# Patient Record
Sex: Female | Born: 1950 | Race: White | Hispanic: No | Marital: Married | Smoking: Never smoker
Health system: Southern US, Community
[De-identification: ages and names within clinical notes are randomized; demographics above are authoritative.]

## PROBLEM LIST (undated history)

## (undated) DIAGNOSIS — K219 Gastro-esophageal reflux disease without esophagitis: Secondary | ICD-10-CM

## (undated) DIAGNOSIS — M1990 Unspecified osteoarthritis, unspecified site: Secondary | ICD-10-CM

## (undated) DIAGNOSIS — I10 Essential (primary) hypertension: Secondary | ICD-10-CM

## (undated) DIAGNOSIS — E119 Type 2 diabetes mellitus without complications: Secondary | ICD-10-CM

## (undated) DIAGNOSIS — K760 Fatty (change of) liver, not elsewhere classified: Secondary | ICD-10-CM

## (undated) DIAGNOSIS — M51369 Other intervertebral disc degeneration, lumbar region without mention of lumbar back pain or lower extremity pain: Secondary | ICD-10-CM

## (undated) DIAGNOSIS — M5136 Other intervertebral disc degeneration, lumbar region: Secondary | ICD-10-CM

## (undated) HISTORY — PX: CHOLECYSTECTOMY: SHX55

## (undated) HISTORY — PX: ARM WOUND REPAIR / CLOSURE: SUR1141

## (undated) HISTORY — PX: DILATION AND CURETTAGE OF UTERUS: SHX78

---

## 1998-05-22 ENCOUNTER — Other Ambulatory Visit: Admission: RE | Admit: 1998-05-22 | Discharge: 1998-05-22 | Payer: Self-pay | Admitting: Obstetrics & Gynecology

## 1999-06-26 ENCOUNTER — Other Ambulatory Visit: Admission: RE | Admit: 1999-06-26 | Discharge: 1999-06-26 | Payer: Self-pay | Admitting: Obstetrics & Gynecology

## 2000-01-15 ENCOUNTER — Emergency Department (HOSPITAL_COMMUNITY): Admission: EM | Admit: 2000-01-15 | Discharge: 2000-01-16 | Payer: Self-pay | Admitting: Emergency Medicine

## 2000-08-20 ENCOUNTER — Other Ambulatory Visit: Admission: RE | Admit: 2000-08-20 | Discharge: 2000-08-20 | Payer: Self-pay | Admitting: Obstetrics & Gynecology

## 2002-01-19 ENCOUNTER — Other Ambulatory Visit: Admission: RE | Admit: 2002-01-19 | Discharge: 2002-01-19 | Payer: Self-pay | Admitting: Obstetrics & Gynecology

## 2009-02-27 ENCOUNTER — Encounter (INDEPENDENT_AMBULATORY_CARE_PROVIDER_SITE_OTHER): Payer: Self-pay | Admitting: *Deleted

## 2009-03-27 ENCOUNTER — Encounter (INDEPENDENT_AMBULATORY_CARE_PROVIDER_SITE_OTHER): Payer: Self-pay

## 2009-03-28 ENCOUNTER — Ambulatory Visit: Payer: Self-pay | Admitting: Gastroenterology

## 2009-04-14 ENCOUNTER — Ambulatory Visit: Payer: Self-pay | Admitting: Gastroenterology

## 2009-04-17 ENCOUNTER — Encounter: Payer: Self-pay | Admitting: Gastroenterology

## 2010-04-03 NOTE — Procedures (Signed)
Summary: Colonoscopy  Patient: [name] Note: All result statuses are Final unless otherwise noted.  Tests: (1) Colonoscopy (COL)   COL Colonoscopy           DONE     [facility]     [address]     [place], [place]  [address]           COLONOSCOPY PROCEDURE REPORT           PATIENT:  Rachel Burns, Rachel Burns  MR#:  [id number]     BIRTHDATE:  1950-05-09, 58 yrs. old  GENDER:  female           ENDOSCOPIST:  Judie Petit T. Russella Dar, MD, [facility]     Referred by:  Juline Patch, M.D.           PROCEDURE DATE:  04/14/2009     PROCEDURE:  Colonoscopy with snare polypectomy     ASA CLASS:  Class II     INDICATIONS:  1) Routine Risk Screening           MEDICATIONS:   Fentanyl 100 mcg IV, Versed 10 mg IV           DESCRIPTION OF PROCEDURE:   After the risks benefits and     alternatives of the procedure were thoroughly explained, informed     consent was obtained.  Digital rectal exam was performed and     revealed no abnormalities.   The LB PCF-H180AL [id number] endoscope     was introduced through the anus and advanced to the cecum, which     was identified by both the appendix and ileocecal valve, limited     by a tortuous colon.    The quality of the prep was excellent,     using MoviPrep.  The instrument was then slowly withdrawn as the     colon was fully examined.     <<PROCEDUREIMAGES>>           FINDINGS:  A sessile polyp was found in the sigmoid colon. It was     5 mm in size. Polyp was snared without cautery. Retrieval was     successful.    Mild diverticulosis was found in the sigmoid colon.     A normal appearing cecum, ileocecal valve, and appendiceal orifice     were identified. The ascending, hepatic flexure, transverse,     splenic flexure, descending, and rectum appeared unremarkable.     Retroflexed views in the rectum revealed no abnormalities.  The     time to cecum =  9.5  minutes. The scope was then withdrawn (time     =  10  min) from the patient and the procedure  completed.           COMPLICATIONS:  None           ENDOSCOPIC IMPRESSION:     1) 5 mm sessile polyp in the sigmoid colon     2) Mild diverticulosis in the sigmoid colon           RECOMMENDATIONS:     1) Await pathology results     2) If the polyp removed today is adenomatous (pre-cancerous),     you will need a repeat colonoscopy in 5 years. Otherwise you     should continue to follow colorectal cancer screening guidelines     for "routine risk" patients with colonoscopy in 10 years.    3)     High fiber diet  Venita Lick. Russella Dar, MD, [facility]           n.     eSIGNED:   Venita Lick. Veronica Guerrant at 04/14/2009 09:02 AM           Tyson Alias, [id number]  Note: An exclamation mark (!) indicates a result that was not dispersed into the flowsheet. Document Creation Date: 04/14/2009 9:02 AM _______________________________________________________________________  (1) Order result status: Final Collection or observation date-time: 04/14/2009 08:58 Requested date-time:  Receipt date-time:  Reported date-time:  Referring Physician:   Ordering Physician: Claudette Head [phone]) Specimen Source:  Source: Launa Grill Order Number: [id number] Lab site:   Appended Document: Colonoscopy     Procedures Next Due Date:    Colonoscopy: 04/2019

## 2010-04-03 NOTE — Letter (Signed)
Summary: [facility] Instructions  [facility]  [address] [place], [place] [address]   Phone: [phone]  Fax: [phone]       Rachel Burns    30-Jul-1950    MRN: [id number]        Procedure Day /Date:  Friday 04/14/2009     Arrival Time: 7:30 am      Procedure Time: 8:30 am     Location of Procedure:                    _x _   Endoscopy Center (4th Floor)                         PREPARATION FOR COLONOSCOPY WITH MOVIPREP   Starting 5 days prior to your procedure Thursday 2/6 do not eat nuts, seeds, popcorn, corn, beans, peas,  salads, or any raw vegetables.  Do not take any fiber supplements (e.g. Metamucil, Citrucel, and Benefiber).  THE DAY BEFORE YOUR PROCEDURE         DATE: Thursday 2/10  1.  Drink clear liquids the entire day-NO SOLID FOOD  2.  Do not drink anything colored red or purple.  Avoid juices with pulp.  No orange juice.  3.  Drink at least 64 oz. (8 glasses) of fluid/clear liquids during the day to prevent dehydration and help the prep work efficiently.  CLEAR LIQUIDS INCLUDE: Water Jello Ice Popsicles Tea (sugar ok, no milk/cream) Powdered fruit flavored drinks Coffee (sugar ok, no milk/cream) Gatorade Juice: apple, white grape, white cranberry  Lemonade Clear bullion, consomm, broth Carbonated beverages (any kind) Strained chicken noodle soup Hard Candy                             4.  In the morning, mix first dose of MoviPrep solution:    Empty 1 Pouch A and 1 Pouch B into the disposable container    Add lukewarm drinking water to the top line of the container. Mix to dissolve    Refrigerate (mixed solution should be used within 24 hrs)  5.  Begin drinking the prep at 5:00 p.m. The MoviPrep container is divided by 4 marks.   Every 15 minutes drink the solution down to the next mark (approximately 8 oz) until the full liter is complete.   6.  Follow completed prep with 16 oz of clear liquid of your choice (Nothing  red or purple).  Continue to drink clear liquids until bedtime.  7.  Before going to bed, mix second dose of MoviPrep solution:    Empty 1 Pouch A and 1 Pouch B into the disposable container    Add lukewarm drinking water to the top line of the container. Mix to dissolve    Refrigerate  THE DAY OF YOUR PROCEDURE      DATE: Friday 2/11  Beginning at 3:30 a.m. (5 hours before procedure):         1. Every 15 minutes, drink the solution down to the next mark (approx 8 oz) until the full liter is complete.  2. Follow completed prep with 16 oz. of clear liquid of your choice.    3. You may drink clear liquids until 6:30 am (2 HOURS BEFORE PROCEDURE).   MEDICATION INSTRUCTIONS  Unless otherwise instructed, you should take regular prescription medications with a small sip of water   as early as possible the morning  of your procedure.   Additional medication instructions:  Do not take  fluid pill am of procedure.         OTHER INSTRUCTIONS  You will need a responsible adult at least 59 years of age to accompany you and drive you home.   This person must remain in the waiting room during your procedure.  Wear loose fitting clothing that is easily removed.  Leave jewelry and other valuables at home.  However, you may wish to bring a book to read or  an iPod/MP3 player to listen to music as you wait for your procedure to start.  Remove all body piercing jewelry and leave at home.  Total time from sign-in until discharge is approximately 2-3 hours.  You should go home directly after your procedure and rest.  You can resume normal activities the  day after your procedure.  The day of your procedure you should not:   Drive   Make legal decisions   Operate machinery   Drink alcohol   Return to work  You will receive specific instructions about eating, activities and medications before you leave.    The above instructions have been reviewed and explained to me by    Ulis Rias RN  March 28, 2009 9:34 AM     I fully understand and can verbalize these instructions _____________________________ Date _________

## 2010-04-03 NOTE — Miscellaneous (Signed)
Summary: Lec previsit  Clinical Lists Changes  Medications: Added new medication of MOVIPREP 100 GM  SOLR (PEG-KCL-NACL-NASULF-NA ASC-C) As per prep instructions. - Signed Rx of MOVIPREP 100 GM  SOLR (PEG-KCL-NACL-NASULF-NA ASC-C) As per prep instructions.;  #1 x 0;  Signed;  Entered by: Ulis Rias RN;  Authorized by: Meryl Dare MD [facility];  Method used: [facility] [address] [address], [place], [place], [place]  [address], Ph: [phone], Fax: [phone] Allergies: Added new allergy or adverse reaction of PENICILLIN Added new allergy or adverse reaction of * ELASTIC Observations: Added new observation of NKA: F (03/28/2009 9:14)    Prescriptions: MOVIPREP 100 GM  SOLR (PEG-KCL-NACL-NASULF-NA ASC-C) As per prep instructions.  #1 x 0   Entered by:   Ulis Rias RN   Authorized by:   Meryl Dare MD [facility]   Signed by:   Ulis Rias RN on 03/28/2009   Method used:   [facility]  [address] [id number]       [address]       [place], [place]  [address]       Ph: [phone]       Fax: [phone]   RxID:   [id number]

## 2010-04-03 NOTE — Letter (Signed)
Summary: Patient Notice- Colon Biospy Results  [facility]  [address] [place], [place] [address]   Phone: [phone]  Fax: [phone]        April 17, 2009 MRN: [id number]    [facility] [address] [place], [place]  [address]    Dear Rachel Burns,  I am pleased to inform you that the biopsies taken during your recent colonoscopy did not show any evidence of cancer upon pathologic examination. The biopsy did not show a polyp. Benign spindle cell proliferation was noted.  Continue with the treatment plan as outlined on the day of your      exam.  You should have a repeat colonoscopy examination in 10 years.  Please call us if you are having persistent problems or have questions about your condition that have not been fully answered at this time.  Sincerely,  Meryl Dare MD [facility]  This letter has been electronically signed by your physician.  Appended Document: Patient Notice- Colon Biospy Results Letter mailed 2.16.11

## 2010-05-30 ENCOUNTER — Other Ambulatory Visit: Payer: Self-pay | Admitting: Obstetrics & Gynecology

## 2011-01-11 ENCOUNTER — Other Ambulatory Visit: Payer: Self-pay | Admitting: Internal Medicine

## 2011-01-11 DIAGNOSIS — J019 Acute sinusitis, unspecified: Secondary | ICD-10-CM

## 2011-01-15 ENCOUNTER — Ambulatory Visit
Admission: RE | Admit: 2011-01-15 | Discharge: 2011-01-15 | Disposition: A | Discharge status: Home / Self Care | Payer: PRIVATE HEALTH INSURANCE | Source: Ambulatory Visit | Attending: Internal Medicine | Admitting: Internal Medicine

## 2011-01-15 DIAGNOSIS — J019 Acute sinusitis, unspecified: Secondary | ICD-10-CM

## 2012-10-13 IMAGING — CT CT MAXILLOFACIAL W/O CM
2 of 4 series · 15 of 37 positions shown, 19 images · non-contrast
Comparison: None.

CLINICAL DATA: acute sinusitis.

CT MAXILLOFACIAL WITHOUT CONTRAST
TECHNIQUE: Multidetector CT imaging of the maxillofacial
structures was performed. Multiplanar CT image reconstructions were
also generated.

[Series 2: ax bone · axial · 0.29mm/px · z∈[-53,+27]mm · 13 of 38 slices shown, 17 images]
[im 3/38  brain]
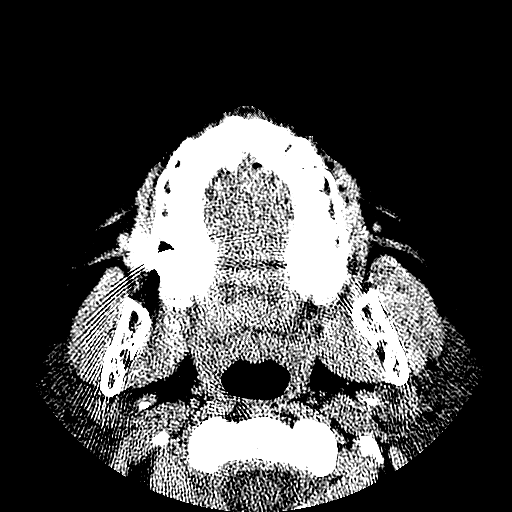
[im 3/38  bone]
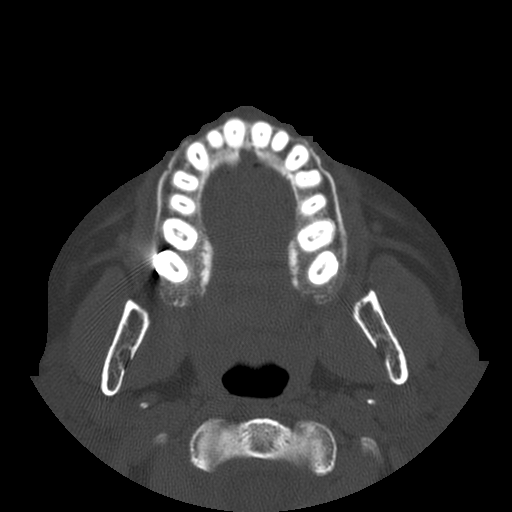
[im 6/38  bone]
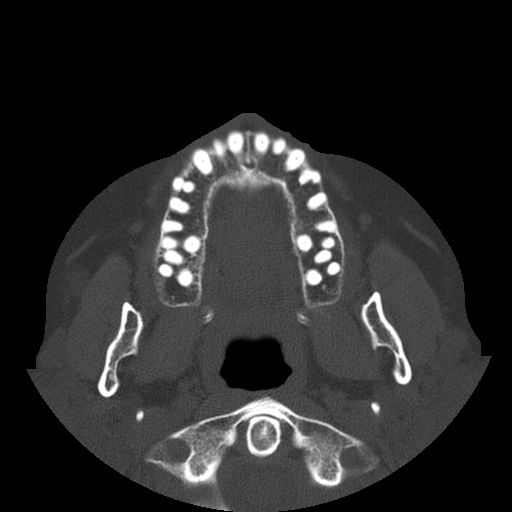
[im 8/38  bone]
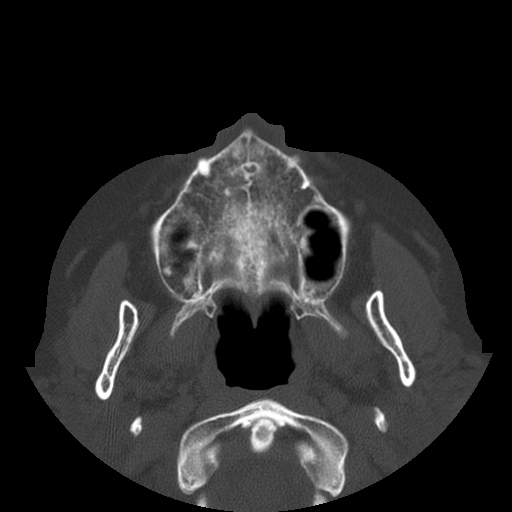
[im 11/38  bone]
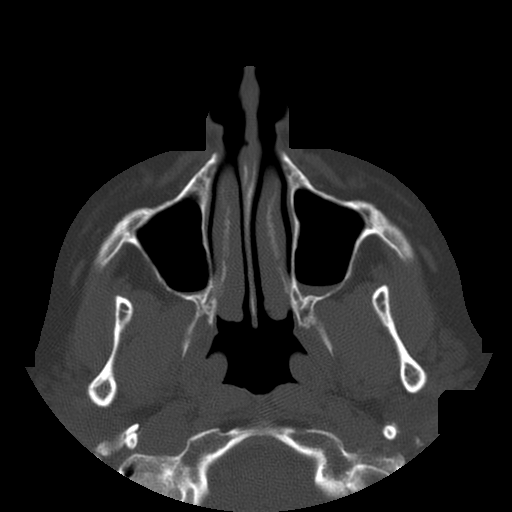
[im 14/38  brain]
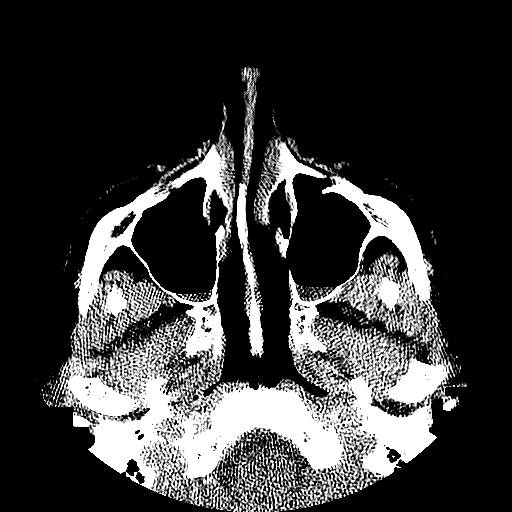
[im 14/38  bone]
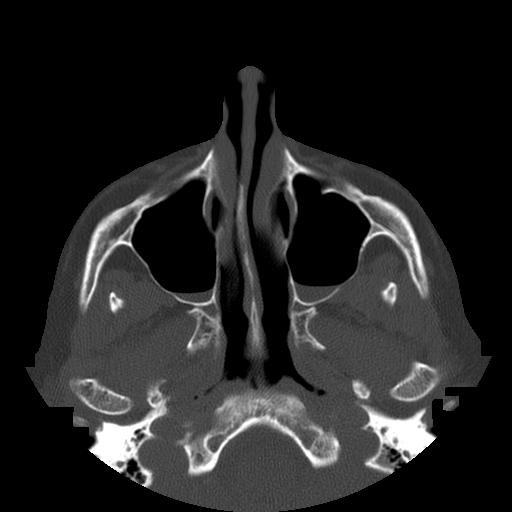
[im 16/38  bone]
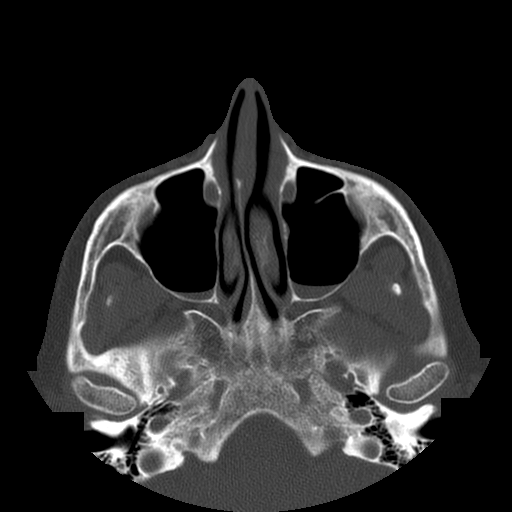
[im 19/38  bone]
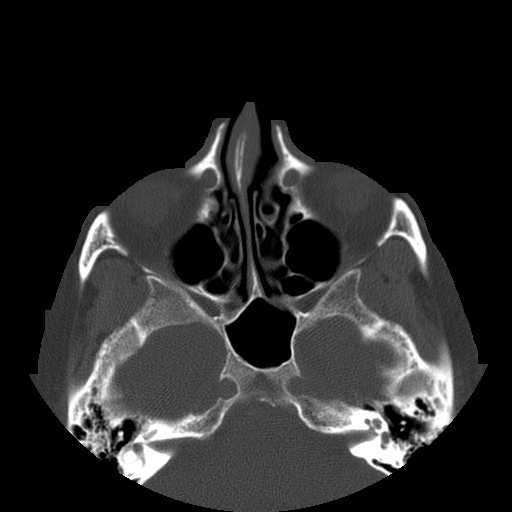
[im 22/38  bone]
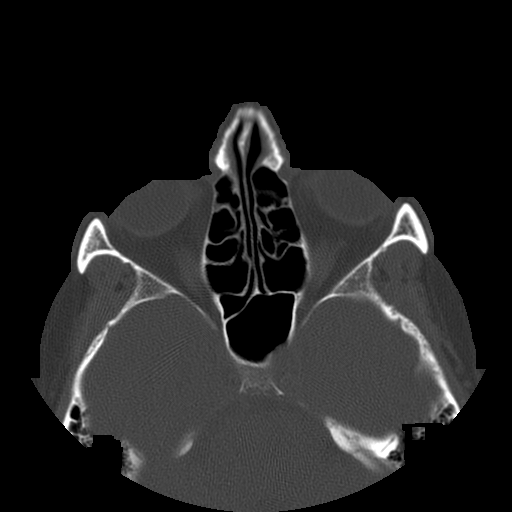
[im 24/38  brain]
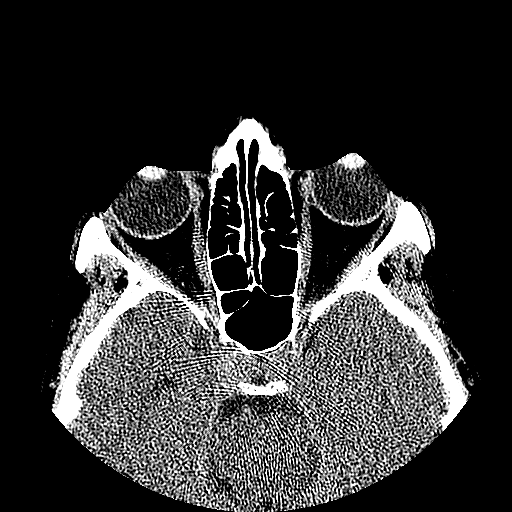
[im 24/38  bone]
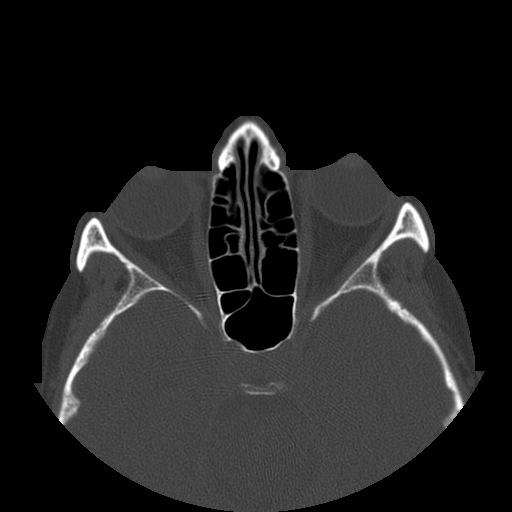
[im 27/38  bone]
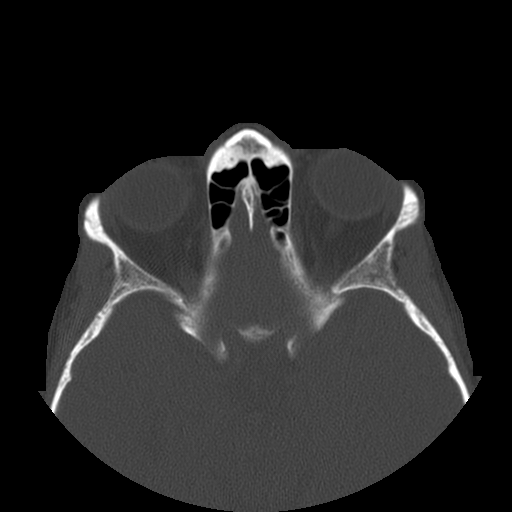
[im 30/38  bone]
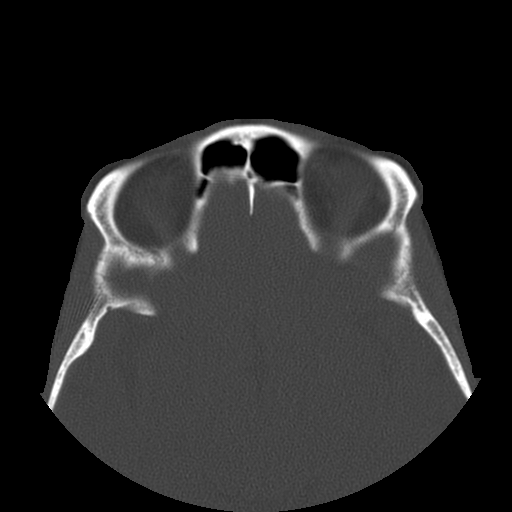
[im 32/38  bone]
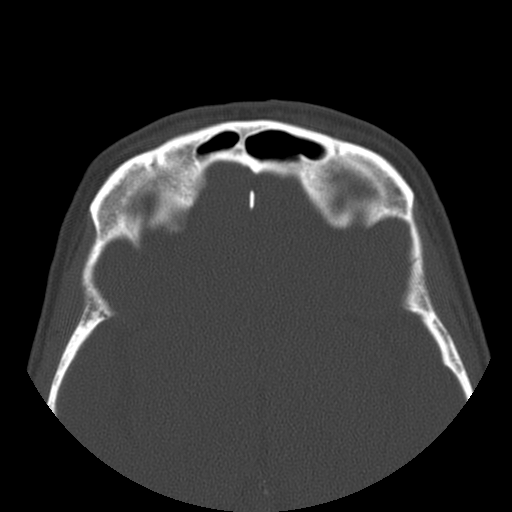
[im 35/38  brain]
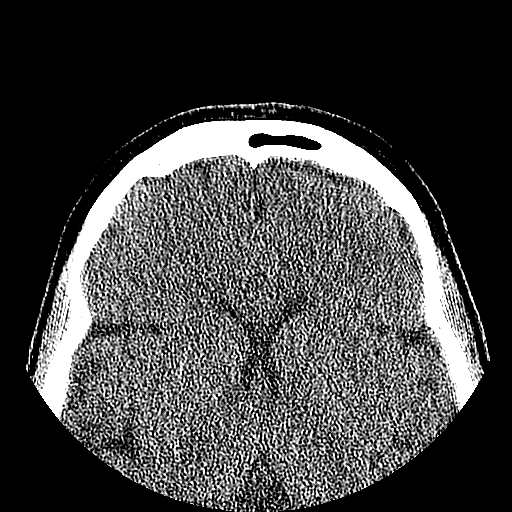
[im 35/38  bone]
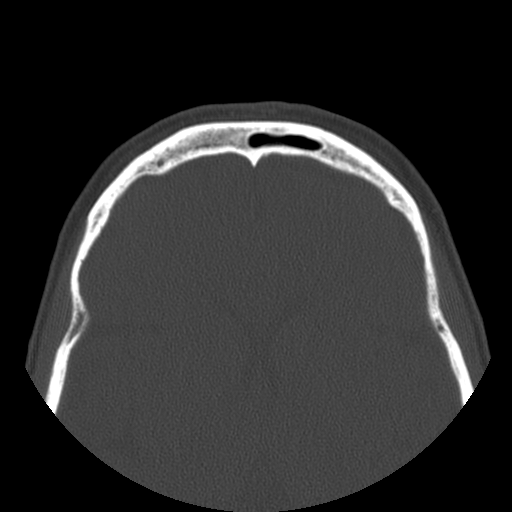

[Series 401: sag · sagittal · 0.29mm/px · 2 of 61 slices shown]
[im 21/61  bone]
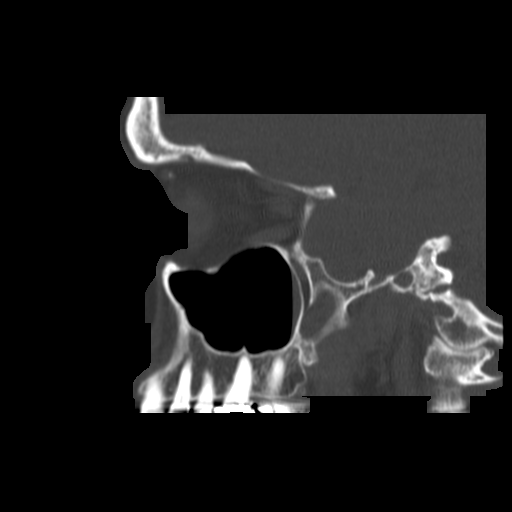
[im 41/61  bone]
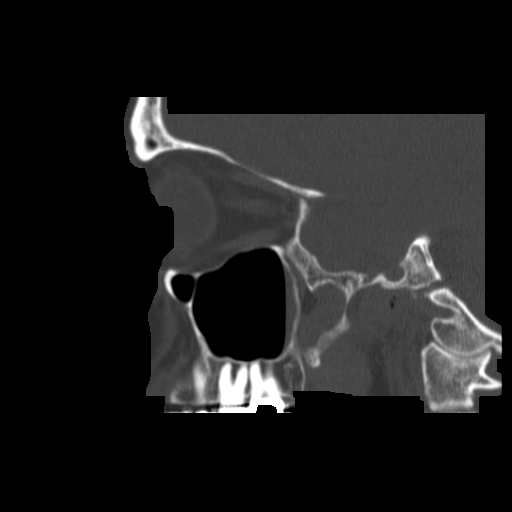

[15 of 37 positions shown; findings below may reference images not displayed]

FINDINGS: Small air-fluid levels in both maxillary sinuses may
represent acute infection.  Remaining sinuses are clear without
additional mucosal thickening.

Nasal septum is deviated to the right.  No bony destruction.  No
soft tissue mass.  The orbit is intact bilaterally.
IMPRESSION: Small air-fluid levels bilateral maxillary sinus.

## 2013-07-07 ENCOUNTER — Other Ambulatory Visit: Payer: Self-pay

## 2014-08-17 ENCOUNTER — Encounter: Payer: Self-pay | Admitting: Gastroenterology

## 2015-12-21 ENCOUNTER — Ambulatory Visit (INDEPENDENT_AMBULATORY_CARE_PROVIDER_SITE_OTHER): Payer: 59 | Admitting: Physical Medicine and Rehabilitation

## 2015-12-21 ENCOUNTER — Encounter (INDEPENDENT_AMBULATORY_CARE_PROVIDER_SITE_OTHER): Payer: Self-pay

## 2015-12-21 DIAGNOSIS — M5116 Intervertebral disc disorders with radiculopathy, lumbar region: Secondary | ICD-10-CM | POA: Diagnosis not present

## 2015-12-21 DIAGNOSIS — M4316 Spondylolisthesis, lumbar region: Secondary | ICD-10-CM

## 2015-12-21 DIAGNOSIS — M5416 Radiculopathy, lumbar region: Secondary | ICD-10-CM

## 2015-12-26 ENCOUNTER — Ambulatory Visit (INDEPENDENT_AMBULATORY_CARE_PROVIDER_SITE_OTHER): Payer: 59 | Admitting: Physical Medicine and Rehabilitation

## 2015-12-26 ENCOUNTER — Encounter (INDEPENDENT_AMBULATORY_CARE_PROVIDER_SITE_OTHER): Payer: Self-pay | Admitting: Physical Medicine and Rehabilitation

## 2015-12-26 VITALS — BP 158/77 | HR 72 | Temp 98.0°F

## 2015-12-26 DIAGNOSIS — M48062 Spinal stenosis, lumbar region with neurogenic claudication: Secondary | ICD-10-CM

## 2015-12-26 DIAGNOSIS — M5416 Radiculopathy, lumbar region: Secondary | ICD-10-CM | POA: Diagnosis not present

## 2015-12-26 MED ORDER — METHYLPREDNISOLONE ACETATE 80 MG/ML IJ SUSP
80.0000 mg | Freq: Once | INTRAMUSCULAR | Status: AC
  Administered 2015-12-26: 80 mg

## 2015-12-26 MED ORDER — METHOCARBAMOL 500 MG PO TABS: 500.0000 mg | ORAL_TABLET | Freq: Three times a day (TID) | ORAL | 1 refills | Status: DC | PRN

## 2015-12-26 MED ORDER — LIDOCAINE HCL (PF) 1 % IJ SOLN
0.3300 mL | Freq: Once | INTRAMUSCULAR | Status: AC
  Administered 2015-12-26: 0.3 mL

## 2015-12-26 NOTE — Progress Notes (Signed)
    Office Visit Note  Patient: Rachel MurphyJanet M Bulls           Date of Birth: [birth year]           MRN: [id number] Visit Date: 12/26/2015              Requested by: Thayer HeadingsBrian Mackenzie, MD [address], [address] [place], [place] [id number] PCP: Thayer HeadingsMACKENZIE,BRIAN, MD   Assessment & Plan: Visit Diagnoses:  1. Spinal stenosis of lumbar region with neurogenic claudication   2. Lumbar radiculopathy     Follow-Up Instructions: Follow up as needed.  Orders:  Orders Placed This Encounter  Procedures  . Epidural Steroid injection    Meds ordered this encounter  Medications  . lidocaine (PF) (XYLOCAINE) 1 % injection 0.3 mL  . methylPREDNISolone acetate (DEPO-MEDROL) injection 80 mg  . methocarbamol (ROBAXIN) 500 MG tablet    Sig: Take 1 tablet (500 mg total) by mouth every 8 (eight) hours as needed for muscle spasms.    Dispense:  90 tablet    Refill:  1      Procedures: No notes on file   Clinical Data: No additional findings.   Subjective: Chief Complaint  Patient presents with  . Lower Back - Pain    Patient here today for planned right L4 & L5 TF injection. States no change in symptoms.  No blood thinners and no dye allergy  Has driver with her today    Review of Systems   Objective: Vital Signs: BP (!) 158/77 (BP Location: Right Arm, Patient Position: Sitting, Cuff Size: Large)   Pulse 72   Temp 98 F (36.7 C) (Oral)   SpO2 98%    Physical Exam  Ortho Exam General appearance: NAD, conversant  Psych: Appropriate affect, alert and oriented to person, place and time  Eyes: anicteric sclerae, moist conjunctivae; no lid-lag; PERRLA Lungs: normal respiratory effort and no intercostal retractions, no wheezing CVA: normal pulses Extremities: No peripheral edema  Skin: Normal temperature, turgor and texture; no rash, ulcers or subcutaneous nodules MSK:/Neuro:   On manual muscle testing there is 5/5 strength in all muscle groups of the lower extremities  bilaterally without deficits. There is no clonus bilaterally.   Specialty Comments:  No specialty comments available. Imaging: No results found.   PMFS History: There are no active problems to display for this patient.  History reviewed. No pertinent past medical history.  History reviewed. No pertinent family history. History reviewed. No pertinent surgical history. Social History   Occupational History  . Not on file.   Social History Main Topics  . Smoking status: Never Smoker  . Smokeless tobacco: Never Used  . Alcohol use Not on file  . Drug use: Unknown  . Sexual activity: Not on file

## 2015-12-29 ENCOUNTER — Telehealth (INDEPENDENT_AMBULATORY_CARE_PROVIDER_SITE_OTHER): Payer: Self-pay | Admitting: Physical Medicine and Rehabilitation

## 2015-12-29 MED ORDER — METHOCARBAMOL 500 MG PO TABS: 500.0000 mg | ORAL_TABLET | Freq: Three times a day (TID) | ORAL | 1 refills | Status: DC | PRN

## 2015-12-29 NOTE — Telephone Encounter (Signed)
So I had ordered but no pharmacy was in [place], [facility] [address]

## 2016-01-08 ENCOUNTER — Telehealth (INDEPENDENT_AMBULATORY_CARE_PROVIDER_SITE_OTHER): Payer: Self-pay | Admitting: Physical Medicine and Rehabilitation

## 2016-01-08 NOTE — Telephone Encounter (Signed)
Repeat if at least 50% effective even if short term and we can talk at appointment or OV to talk more.  Tell her second injection very common occurrence although may have issue with [place]

## 2016-01-09 NOTE — Telephone Encounter (Signed)
Spoke with [employer] DataCarla Burns. And no precert required for [id number] or [id number]. I called pt and scheduled her for 01/16/16 @ 2:15 w/driver and she is on no blood thinners

## 2016-01-16 ENCOUNTER — Encounter (INDEPENDENT_AMBULATORY_CARE_PROVIDER_SITE_OTHER): Payer: 59 | Admitting: Physical Medicine and Rehabilitation

## 2016-01-17 ENCOUNTER — Encounter (INDEPENDENT_AMBULATORY_CARE_PROVIDER_SITE_OTHER): Payer: Self-pay | Admitting: Physical Medicine and Rehabilitation

## 2016-01-17 ENCOUNTER — Ambulatory Visit (INDEPENDENT_AMBULATORY_CARE_PROVIDER_SITE_OTHER): Payer: 59 | Admitting: Physical Medicine and Rehabilitation

## 2016-01-17 VITALS — BP 156/88 | HR 92 | Temp 97.8°F

## 2016-01-17 DIAGNOSIS — M48062 Spinal stenosis, lumbar region with neurogenic claudication: Secondary | ICD-10-CM | POA: Diagnosis not present

## 2016-01-17 DIAGNOSIS — M5416 Radiculopathy, lumbar region: Secondary | ICD-10-CM | POA: Diagnosis not present

## 2016-01-17 MED ORDER — LIDOCAINE HCL (PF) 1 % IJ SOLN
0.3300 mL | Freq: Once | INTRAMUSCULAR | Status: AC
  Administered 2016-01-17: 0.3 mL

## 2016-01-17 MED ORDER — METHYLPREDNISOLONE ACETATE 80 MG/ML IJ SUSP
80.0000 mg | Freq: Once | INTRAMUSCULAR | Status: AC
  Administered 2016-01-17: 80 mg

## 2016-01-17 NOTE — Progress Notes (Signed)
[facility] 65 [facility] MRN [id number]  Date of birth: [birth year]  Office Visit Note: Visit Date: 01/17/2016 PCP: Thayer HeadingsMACKENZIE,BRIAN, MD Referred by: Thayer HeadingsMackenzie, Brian, MD  Subjective: Chief Complaint  Patient presents with  . Lower Back - Pain   HPI: Rachel Burns is very pleasant 65 year old Burns that comes in today for planned right L4-L5 transforaminal injection for pretty severe multifactorial stenosis. Patient states she had some improvement with last injection. Seems to be worse with increased activity and worse first thing in the morning. Good days and bad days. Radiating into right thigh. Intense sharp right buttock pain with sitting. Some numbness and tingling in thigh. She does want point out to us that the pain in the right buttock which is to the right of midline really over the sacroiliac joint area is really a different type pain that she had before. It is worse with sitting us in standing.    ROS Otherwise per HPI.  Assessment & Plan: Visit Diagnoses:  1. Spinal stenosis of lumbar region with neurogenic claudication   2. Lumbar radiculopathy     Plan: Findings:  I am going to repeat a right L4-L5 transforaminal epidural steroid injection for lumbar stenosis and right radicular leg pain.    Meds & Orders:  Meds ordered this encounter  Medications  . lidocaine (PF) (XYLOCAINE) 1 % injection 0.3 mL  . methylPREDNISolone acetate (DEPO-MEDROL) injection 80 mg    Orders Placed This Encounter  Procedures  . Epidural Steroid injection    Follow-up: Return if symptoms worsen or fail to improve.   Procedures: No procedures performed  Lumbosacral Transforaminal Epidural Steroid Injection - Sub-Pedicular Approach with Fluoroscopic Guidance  [name]      Date of Birth: [birth year] MRN: [id number] PCP: Thayer HeadingsMACKENZIE,BRIAN, MD      Visit Date: 01/17/2016   Universal Protocol:    Date/Time: 11/15/172:19 PM  Consent Given By: the patient  Position:  prone  Additional Comments: Vital signs were monitored before and after the procedure. Patient was prepped and draped in the usual sterile fashion. The correct patient, procedure, and site was verified.   Injection Procedure Details:  Procedure Site One Meds Administered:  Meds ordered this encounter  Medications  . lidocaine (PF) (XYLOCAINE) 1 % injection 0.3 mL  . methylPREDNISolone acetate (DEPO-MEDROL) injection 80 mg    Laterality: Right  Location/Site:  L4-L5 L5-S1  Needle size: 22 G  Needle type: Spinal  Needle Placement: Transforaminal  Findings:  -Contrast Used: 1 mL iohexol 180 mg iodine/mL   -Comments: Excellent flow of contrast along the nerve and into the epidural space.  Procedure Details: After squaring off the end-plates to get a true AP view, the C-arm was positioned so that an oblique view of the foramen as noted above was visualized. The target area is just inferior to the "nose of the scotty dog" or sub pedicular. The soft tissues overlying this structure were infiltrated with 2-3 ml. of 1% Lidocaine without Epinephrine.  The spinal needle was inserted toward the target using a "trajectory" view along the fluoroscope beam.  Under AP and lateral visualization, the needle was advanced so it did not puncture dura and was located close the 6 O'Clock position of the pedical in AP tracterory. Biplanar projections were used to confirm position. Aspiration was confirmed to be negative for CSF and/or blood. A 1-2 ml. volume of Isovue-250 was injected and flow of contrast was noted at each level. Radiographs were obtained for documentation  purposes.   After attaining the desired flow of contrast documented above, a 0.5 to 1.0 ml test dose of 0.25% Marcaine was injected into each respective transforaminal space.  The patient was observed for 90 seconds post injection.  After no sensory deficits were reported, and normal lower extremity motor function was noted,   the  above injectate was administered so that equal amounts of the injectate were placed at each foramen (level) into the transforaminal epidural space.   Additional Comments:  The patient tolerated the procedure well Dressing: Band-Aid    Post-procedure details: Patient was observed during the procedure. Post-procedure instructions were reviewed.  Patient left the clinic in stable condition.       Clinical History: No specialty comments available.  She reports that she has never smoked. She has never used smokeless tobacco. No results for input(s): HGBA1C, LABURIC in the last 8760 hours.  Objective:  VS:  HT:    WT:   BMI:     BP:(!) 156/88  HR:92bpm  TEMP:97.8 F (36.6 C)(Oral)  RESP:98 % Physical Exam  Ortho Exam Imaging: No results found.  Past Medical/Family/Surgical/Social History: Medications & Allergies reviewed per EMR Patient Active Problem List   Diagnosis Date Noted  . Spinal stenosis of lumbar region with neurogenic claudication 01/17/2016  . Lumbar radiculopathy 01/17/2016   No past medical history on file. No family history on file. No past surgical history on file. Social History   Occupational History  . Not on file.   Social History Main Topics  . Smoking status: Never Smoker  . Smokeless tobacco: Never Used  . Alcohol use Not on file  . Drug use: Unknown  . Sexual activity: Not on file

## 2016-01-17 NOTE — Patient Instructions (Signed)

## 2016-01-17 NOTE — Procedures (Signed)
Lumbosacral Transforaminal Epidural Steroid Injection - Sub-Pedicular Approach with Fluoroscopic Guidance  Patient: Rachel MurphyJanet M Burns      Date of Birth: Mar 04, 1951 MRN: [id number] PCP: Thayer HeadingsMACKENZIE,BRIAN, MD      Visit Date: 01/17/2016   Universal Protocol:    Date/Time: 11/15/172:19 PM  Consent Given By: the patient  Position: prone  Additional Comments: Vital signs were monitored before and after the procedure. Patient was prepped and draped in the usual sterile fashion. The correct patient, procedure, and site was verified.   Injection Procedure Details:  Procedure Site One Meds Administered:  Meds ordered this encounter  Medications  . lidocaine (PF) (XYLOCAINE) 1 % injection 0.3 mL  . methylPREDNISolone acetate (DEPO-MEDROL) injection 80 mg    Laterality: Right  Location/Site:  L4-L5 L5-S1  Needle size: 22 G  Needle type: Spinal  Needle Placement: Transforaminal  Findings:  -Contrast Used: 1 mL iohexol 180 mg iodine/mL   -Comments: Excellent flow of contrast along the nerve and into the epidural space.  Procedure Details: After squaring off the end-plates to get a true AP view, the C-arm was positioned so that an oblique view of the foramen as noted above was visualized. The target area is just inferior to the "nose of the scotty dog" or sub pedicular. The soft tissues overlying this structure were infiltrated with 2-3 ml. of 1% Lidocaine without Epinephrine.  The spinal needle was inserted toward the target using a "trajectory" view along the fluoroscope beam.  Under AP and lateral visualization, the needle was advanced so it did not puncture dura and was located close the 6 O'Clock position of the pedical in AP tracterory. Biplanar projections were used to confirm position. Aspiration was confirmed to be negative for CSF and/or blood. A 1-2 ml. volume of Isovue-250 was injected and flow of contrast was noted at each level. Radiographs were obtained for documentation  purposes.   After attaining the desired flow of contrast documented above, a 0.5 to 1.0 ml test dose of 0.25% Marcaine was injected into each respective transforaminal space.  The patient was observed for 90 seconds post injection.  After no sensory deficits were reported, and normal lower extremity motor function was noted,   the above injectate was administered so that equal amounts of the injectate were placed at each foramen (level) into the transforaminal epidural space.   Additional Comments:  The patient tolerated the procedure well Dressing: Band-Aid    Post-procedure details: Patient was observed during the procedure. Post-procedure instructions were reviewed.  Patient left the clinic in stable condition.

## 2016-02-14 ENCOUNTER — Ambulatory Visit (INDEPENDENT_AMBULATORY_CARE_PROVIDER_SITE_OTHER): Payer: 59 | Admitting: Physical Medicine and Rehabilitation

## 2016-11-13 ENCOUNTER — Other Ambulatory Visit: Payer: Self-pay | Admitting: Internal Medicine

## 2016-11-13 DIAGNOSIS — R7401 Elevation of levels of liver transaminase levels: Secondary | ICD-10-CM

## 2016-11-13 DIAGNOSIS — R740 Nonspecific elevation of levels of transaminase and lactic acid dehydrogenase [LDH]: Principal | ICD-10-CM

## 2018-12-08 ENCOUNTER — Other Ambulatory Visit: Payer: Self-pay

## 2018-12-08 DIAGNOSIS — Z20822 Contact with and (suspected) exposure to covid-19: Secondary | ICD-10-CM

## 2018-12-10 LAB — NOVEL CORONAVIRUS, NAA: SARS-CoV-2, NAA: NOT DETECTED

## 2019-04-16 ENCOUNTER — Ambulatory Visit: Payer: PRIVATE HEALTH INSURANCE | Attending: Internal Medicine

## 2019-04-16 DIAGNOSIS — Z23 Encounter for immunization: Secondary | ICD-10-CM | POA: Insufficient documentation

## 2019-04-16 NOTE — Progress Notes (Signed)
   Covid-19 Vaccination Clinic  Name:  Rachel Burns    MRN: [id number] DOB: [birth year]  04/16/2019  [name] was observed post Covid-19 immunization for 15 minutes without incidence. She was provided with Vaccine Information Sheet and instruction to access the V-Safe system.   [name] was instructed to call 911 with any severe reactions post vaccine: Marland Kitchen Difficulty breathing  . Swelling of your face and throat  . A fast heartbeat  . A bad rash all over your body  . Dizziness and weakness    Immunizations Administered    Name Date Dose VIS Date Route   Pfizer COVID-19 Vaccine 04/16/2019 12:14 PM 0.3 mL 02/12/2019 Intramuscular   Manufacturer: [employer], [employer]   Lot: [id number]   NDC: [address]-9

## 2019-05-08 ENCOUNTER — Ambulatory Visit: Payer: PRIVATE HEALTH INSURANCE | Attending: Internal Medicine

## 2019-05-08 DIAGNOSIS — Z23 Encounter for immunization: Secondary | ICD-10-CM | POA: Insufficient documentation

## 2019-05-08 NOTE — Progress Notes (Signed)
   Covid-19 Vaccination Clinic  Name:  Rachel Burns    MRN: [id number] DOB: 1950-05-29  05/08/2019  [name] was observed post Covid-19 immunization for 15 minutes without incident. She was provided with Vaccine Information Sheet and instruction to access the V-Safe system.   [name] was instructed to call 911 with any severe reactions post vaccine: Marland Kitchen Difficulty breathing  . Swelling of face and throat  . A fast heartbeat  . A bad rash all over body  . Dizziness and weakness   Immunizations Administered    Name Date Dose VIS Date Route   Pfizer COVID-19 Vaccine 05/08/2019  3:24 PM 0.3 mL 02/12/2019 Intramuscular   Manufacturer: [employer], [employer]   Lot: [id number]   NDC: [address]-6

## 2020-03-01 ENCOUNTER — Telehealth: Payer: Self-pay | Admitting: Physical Medicine and Rehabilitation

## 2020-03-01 NOTE — Telephone Encounter (Signed)
Pt called stating her back pain has returned, it is her upper back this time though. Pt would like to get an appt set please  [phone]

## 2020-03-02 NOTE — Telephone Encounter (Signed)
Patient reports that her pain is in a different area. Scheduled for OV on 1/19. Added to wait list.

## 2020-03-08 ENCOUNTER — Other Ambulatory Visit: Payer: Self-pay

## 2020-03-08 ENCOUNTER — Ambulatory Visit: Payer: PRIVATE HEALTH INSURANCE | Admitting: Physical Medicine and Rehabilitation

## 2020-03-08 ENCOUNTER — Encounter: Payer: Self-pay | Admitting: Physical Medicine and Rehabilitation

## 2020-03-08 VITALS — BP 147/78 | HR 109 | Temp 98.5°F | Ht 62.5 in | Wt 183.0 lb

## 2020-03-08 DIAGNOSIS — M549 Dorsalgia, unspecified: Secondary | ICD-10-CM

## 2020-03-08 DIAGNOSIS — M898X1 Other specified disorders of bone, shoulder: Secondary | ICD-10-CM | POA: Diagnosis not present

## 2020-03-08 DIAGNOSIS — G8929 Other chronic pain: Secondary | ICD-10-CM | POA: Diagnosis not present

## 2020-03-08 DIAGNOSIS — M7918 Myalgia, other site: Secondary | ICD-10-CM

## 2020-03-08 MED ORDER — CYCLOBENZAPRINE HCL 10 MG PO TABS: 10.0000 mg | ORAL_TABLET | Freq: Three times a day (TID) | ORAL | 0 refills | Status: DC | PRN

## 2020-03-08 MED ORDER — TRAMADOL HCL 50 MG PO TABS: 50.0000 mg | ORAL_TABLET | Freq: Two times a day (BID) | ORAL | 0 refills | Status: DC | PRN

## 2020-03-08 NOTE — Progress Notes (Signed)
Numeric Pain Rating Scale and Functional Assessment Average Pain 5   In the last MONTH (on 0-10 scale) has pain interfered with the following?  1. General activity like being  able to carry out your everyday physical activities such as walking, climbing stairs, carrying groceries, or moving a chair?  Rating(8)  Pain on left side under shoulder blade and radiates down back when leaning forward. PCP gave Tarmadol PRN +Driver, -BT, -Dye Allergies.

## 2020-03-15 LAB — COLOGUARD: COLOGUARD: NEGATIVE

## 2020-03-22 ENCOUNTER — Ambulatory Visit: Payer: PRIVATE HEALTH INSURANCE | Admitting: Physical Medicine and Rehabilitation

## 2020-08-28 ENCOUNTER — Encounter: Payer: Self-pay | Admitting: Physical Medicine and Rehabilitation

## 2020-08-28 MED ORDER — LIDOCAINE HCL 1 % IJ SOLN
3.0000 mL | INTRAMUSCULAR | Status: AC | PRN
  Administered 2020-03-08: 3 mL

## 2020-08-28 MED ORDER — TRIAMCINOLONE ACETONIDE 40 MG/ML IJ SUSP
20.0000 mg | INTRAMUSCULAR | Status: AC | PRN
  Administered 2020-03-08: 20 mg via INTRAMUSCULAR

## 2020-08-28 NOTE — Progress Notes (Signed)
Braeley Buskey [facility] - 70 y.o. female MRN [id number]  Date of birth: 1950-08-02  Office Visit Note: Visit Date: 03/08/2020 PCP: Thayer Headings, MD (Inactive) Referred by: No ref. provider found  Subjective: No chief complaint on file.  HPI: Rachel Burns is a 70 y.o. female who comes in today For evaluation and management of chronic worsening left shoulder blade and parascapular pain.  She reports 6 out of 10 quite severe pain that really limits her daily activities.  She has a hard time moving her shoulder around to the pain at this point.  Nothing radicular down the arms or paresthesias.  Her symptoms are worse with movement and certain positions.  She reports no specific injury but acute on onset with some bit of improvement but not much.  She reports that her primary care physician gave her some tramadol and this did take the edge off.  I did review the controlled substance list.  She has no aberrant usage.  She has not been taking a muscle relaxer but has taken Flexeril in the past.  She is having difficulty sleeping at night due to the pain.  She has not had any right-sided complaints.  I have actually seen the patient in the past.  The last time I saw her was in 2017.  This was for pretty severe lumbar stenosis at L4-5.  No complaints of that ongoing today.  She has not had any recent imaging of the cervical spine.  She has had no fevers chills or night sweats no balance difficulties no associated headaches.  No red flag complaints other than just pain.  Review of Systems  Musculoskeletal:  Positive for back pain and neck pain.  All other systems reviewed and are negative. Otherwise per HPI.  Assessment & Plan: Visit Diagnoses:    ICD-10-CM   1. Mid back pain on left side  M54.9     2. Chronic scapular pain  M89.8X1    G89.29     3. Myofascial pain syndrome  M79.18        Plan: Findings:  Several month history of progressive worsening severe left mid back and parascapular pain.  This is  likely myofascial in nature given physical exam findings of focal trigger points with pain to palpation that are concordant.  Could be subscapular bursitis versus potentially a C5 radiculitis.  At this point we did complete trigger point injections today and the patient did seem to have some relief even after that.  We may need to get her into physical therapy with potential for dry needling.  If this does not last very long would look at perhaps MRI imaging of the cervical spine versus thoracic although this is rarely a thoracic issue.  I did give her a refill of tramadol.  This would be a one-time refill.  I also gave her a refill of Flexeril.  She is to call us back depending on her relief.   Meds & Orders:  Meds ordered this encounter  Medications   traMADol (ULTRAM) 50 MG tablet    Sig: Take 1 tablet (50 mg total) by mouth every 12 (twelve) hours as needed.    Dispense:  30 tablet    Refill:  0   cyclobenzaprine (FLEXERIL) 10 MG tablet    Sig: Take 1 tablet (10 mg total) by mouth 3 (three) times daily as needed for muscle spasms.    Dispense:  30 tablet    Refill:  0    Orders  Placed This Encounter  Procedures   Trigger Point Inj    Follow-up: Return if symptoms worsen or fail to improve.   Procedures: Trigger Point Inj  Date/Time: 03/08/2020 9:14 AM Performed by: Tyrell Antonio, MD Authorized by: Tyrell Antonio, MD   Consent Given by:  Patient Site marked: the procedure site was marked   Timeout: prior to procedure the correct patient, procedure, and site was verified   Total # of Trigger Points:  3 or more Location: neck and back   Needle Size:  25 G Approach:  Dorsal Medications #1:  20 mg triamcinolone acetonide 40 MG/ML Medications #2:  3 mL lidocaine 1 % Additional Injections?: No   Patient tolerance:  Patient tolerated the procedure well with no immediate complications Comments: Trigger points palpated in the left rhomboid and infraspinatus and paraspinal  musculature.  Needling technique utilized      Clinical History: MRI LUMBAR SPINE WITHOUT CONTRAST  TECHNIQUE: Multiplanar, multisequence MR imaging of the lumbar spine was performed. No intravenous contrast was administered.  COMPARISON: None.  FINDINGS: Segmentation: 5 lumbar type vertebral bodies present.  Alignment: No scoliotic curvature.  Vertebrae: No primary bone lesion.  Conus medullaris: Extends to the L1 level and appears normal.  Paraspinal and other soft tissues: No significant finding.  Disc levels: No abnormality at T12-L1, L1-2, L2-3 for L3-4.  L4-5: Advanced bilateral facet arthropathy with moderate edematous change. Anterolisthesis of 7 mm. Focal right foraminal to extra foraminal disc herniation. Nerve compression could occur in the central canal and lateral recesses. The right L4 nerve root is quite likely affected by the focal disc herniation.  L5-S1: Bilateral facet arthropathy with anterolisthesis of 3 mm. No disc bulge or herniation. No compressive stenosis.  IMPRESSION: L4-5: Advanced bilateral facet arthropathy with 7 mm of anterolisthesis. Spinal stenosis of the central canal and lateral recesses that could cause neural compression. Focal right foraminal to extra foraminal disc herniation likely to focally compress the right L4 nerve root.  L5-S1: Advanced bilateral facet arthropathy with 3 mm of anterolisthesis. No compressive stenosis.   Electronically Signed By: Paulina Fusi M.D. On: 08/07/2015 21:14   She reports that she has never smoked. She has never used smokeless tobacco. No results for input(s): HGBA1C, LABURIC in the last 8760 hours.  Objective:  VS:  HT:5' 2.5" (158.8 cm)   WT:183 lb (83 kg)  BMI:32.92    BP:(!) 147/78  HR:(!) 109bpm  TEMP:98.5 F (36.9 C)(Oral)  RESP:  Physical Exam Vitals and nursing note reviewed.  Constitutional:      General: She is not in acute distress.    Appearance: Normal appearance. She is  not ill-appearing.  HENT:     Head: Normocephalic and atraumatic.     Right Ear: External ear normal.     Left Ear: External ear normal.  Eyes:     Extraocular Movements: Extraocular movements intact.  Cardiovascular:     Rate and Rhythm: Normal rate.     Pulses: Normal pulses.  Musculoskeletal:     Cervical back: Tenderness present. No rigidity.     Right lower leg: No edema.     Left lower leg: No edema.     Comments: Patient has good strength in the upper extremities including 5 out of 5 strength in wrist extension long finger flexion and APB.  There is no atrophy of the hands intrinsically.  There is a negative Hoffmann's test.  There are active trigger points in the left rhomboid and infraspinatus and paraspinal musculature.  There is some latent trigger points in the bilateral trapezius and levator scapula.  Patient does not have any shoulder impingement signs.   Lymphadenopathy:     Cervical: No cervical adenopathy.  Skin:    Findings: No erythema, lesion or rash.  Neurological:     General: No focal deficit present.     Mental Status: She is alert and oriented to person, place, and time.     Sensory: No sensory deficit.     Motor: No weakness or abnormal muscle tone.     Coordination: Coordination normal.  Psychiatric:        Mood and Affect: Mood normal.        Behavior: Behavior normal.    Ortho Exam  Imaging: No results found.  Past Medical/Family/Surgical/Social History: Medications & Allergies reviewed per EMR, new medications updated. Patient Active Problem List   Diagnosis Date Noted   Spinal stenosis of lumbar region with neurogenic claudication 01/17/2016   Lumbar radiculopathy 01/17/2016   History reviewed. No pertinent past medical history. History reviewed. No pertinent family history. History reviewed. No pertinent surgical history. Social History   Occupational History   Not on file  Tobacco Use   Smoking status: Never   Smokeless tobacco:  Never  Substance and Sexual Activity   Alcohol use: Not on file   Drug use: Not on file   Sexual activity: Not on file

## 2021-11-02 ENCOUNTER — Telehealth: Payer: Self-pay | Admitting: Physical Medicine and Rehabilitation

## 2021-11-02 NOTE — Telephone Encounter (Signed)
Patient would like an appointment with Dr. Alvester Morin call back number is [phone]

## 2021-11-12 ENCOUNTER — Ambulatory Visit (INDEPENDENT_AMBULATORY_CARE_PROVIDER_SITE_OTHER): Payer: 59 | Admitting: Physical Medicine and Rehabilitation

## 2021-11-12 ENCOUNTER — Encounter: Payer: Self-pay | Admitting: Physical Medicine and Rehabilitation

## 2021-11-12 VITALS — BP 145/85 | HR 82 | Ht 62.0 in | Wt 180.0 lb

## 2021-11-12 DIAGNOSIS — M48062 Spinal stenosis, lumbar region with neurogenic claudication: Secondary | ICD-10-CM

## 2021-11-12 DIAGNOSIS — M5416 Radiculopathy, lumbar region: Secondary | ICD-10-CM | POA: Diagnosis not present

## 2021-11-12 DIAGNOSIS — G8929 Other chronic pain: Secondary | ICD-10-CM

## 2021-11-12 DIAGNOSIS — M4316 Spondylolisthesis, lumbar region: Secondary | ICD-10-CM | POA: Diagnosis not present

## 2021-11-12 DIAGNOSIS — M25561 Pain in right knee: Secondary | ICD-10-CM

## 2021-11-12 DIAGNOSIS — M25562 Pain in left knee: Secondary | ICD-10-CM

## 2021-11-12 DIAGNOSIS — M4726 Other spondylosis with radiculopathy, lumbar region: Secondary | ICD-10-CM

## 2021-11-12 MED ORDER — CYCLOBENZAPRINE HCL 10 MG PO TABS: 10.0000 mg | ORAL_TABLET | Freq: Three times a day (TID) | ORAL | 0 refills | Status: DC | PRN

## 2021-11-12 NOTE — Progress Notes (Unsigned)
Rachel Burns [facility] - 71 y.o. female MRN [id number]  Date of birth: Jun 19, 1950  Office Visit Note: Visit Date: 11/12/2021 PCP: Thayer Headings, MD (Inactive) Referred by: No ref. provider found  Subjective: Chief Complaint  Patient presents with   Lower Back - Pain   Right Knee - Pain   Left Knee - Pain   HPI: Rachel Burns is a 71 y.o. female who comes in today for evaluation of chronic, worsening and severe right sided lower back pain radiating down right leg. Pain ongoing for several years and is exacerbated by prolonged standing and walking. Patient states pain worsened recently after picking up granddaughter. She describes pain as sore, aching and tingling sensation, currently rates as 7 out of 10. Some relief of pain with home exercise regimen, rest and use of medications. States some relief of pain with Flexeril as needed. Lumbar MRI imaging from 2017 exhibits advanced bilateral facet arthropathy with 7 mm of anterolisthesis at L4-L5. Spinal stenosis of the central canal and lateral recesses could cause neural compression at this level. Patient underwent right L4 and L5 transforaminal epidural steroid injection in our office in 2017, reports significant and sustained relief of pain with this procedure. Patient does work as Pensions consultant, severe pain is negatively impacting her daily life and ability to function. Patient denies focal weakness. Patient denies recent trauma or falls.   Incidentally, patient did mention pain to bilateral knees. Pain ongoing for several months and worsens with movement, activity and walking. She is unsure if this is a back issue or more of an isolated knee problem. States bilateral knee pain seems to correlate with back issues.     Review of Systems  Musculoskeletal:  Positive for back pain.  Neurological:  Negative for tingling, sensory change, focal weakness and weakness.  All other systems reviewed and are negative.  Otherwise per HPI.  Assessment & Plan: Visit  Diagnoses:    ICD-10-CM   1. Lumbar radiculopathy  M54.16 Ambulatory referral to Physical Medicine Rehab    2. Spinal stenosis of lumbar region with neurogenic claudication  M48.062     3. Anterolisthesis of lumbar spine  M43.16     4. Other spondylosis with radiculopathy, lumbar region  M47.26     5. Chronic pain of both knees  M25.561    M25.562    G89.29        Plan: Findings:  1. Chronic, worsening and severe right sided lower back pain radiating down right leg. Patient continues to have severe pain despite good conservative therapies such as home exercise regimen, rest and use of medications. Patients clinical presentation and exam are consistent with neurogenic claudication as a result of spinal canal stenosis. There is severe spinal canal stenosis at the level of L4-L5. Next step is to repeat right L4 and L5 transforaminal epidural steroid injection under fluoroscopic guidance. I did speak with patient about medication management and refilled Flexeril. If her pain persists post injection I did discuss possibility of obtaining new lumbar MRI imaging and possible consult with neurosurgeon to discuss treatment options.   2. If her bilateral knee pain persists post injection we did discuss follow up with orthopedic surgeon for formal evaluation.       Meds & Orders:  Meds ordered this encounter  Medications   cyclobenzaprine (FLEXERIL) 10 MG tablet    Sig: Take 1 tablet (10 mg total) by mouth 3 (three) times daily as needed for muscle spasms.    Dispense:  60 tablet  Refill:  0    Orders Placed This Encounter  Procedures   Ambulatory referral to Physical Medicine Rehab    Follow-up: Return for Right L4 and L5 transforaminal epidural steroid injection.   Procedures: No procedures performed      Clinical History: MRI LUMBAR SPINE WITHOUT CONTRAST   TECHNIQUE:  Multiplanar, multisequence MR imaging of the lumbar spine was  performed. No intravenous contrast was  administered.   COMPARISON: None.   FINDINGS:  Segmentation: 5 lumbar type vertebral bodies present.   Alignment: No scoliotic curvature.   Vertebrae: No primary bone lesion.   Conus medullaris: Extends to the L1 level and appears normal.   Paraspinal and other soft tissues: No significant finding.   Disc levels: No abnormality at T12-L1, L1-2, L2-3 for L3-4.   L4-5: Advanced bilateral facet arthropathy with moderate edematous  change. Anterolisthesis of 7 mm. Focal right foraminal to extra  foraminal disc herniation. Nerve compression could occur in the  central canal and lateral recesses. The right L4 nerve root is quite  likely affected by the focal disc herniation.   L5-S1: Bilateral facet arthropathy with anterolisthesis of 3 mm. No  disc bulge or herniation. No compressive stenosis.   IMPRESSION:  L4-5: Advanced bilateral facet arthropathy with 7 mm of  anterolisthesis. Spinal stenosis of the central canal and lateral  recesses that could cause neural compression. Focal right foraminal  to extra foraminal disc herniation likely to focally compress the  right L4 nerve root.   L5-S1: Advanced bilateral facet arthropathy with 3 mm of  anterolisthesis. No compressive stenosis.    Electronically Signed  By: Paulina Fusi M.D.  On: 08/07/2015 21:14   She reports that she has never smoked. She has never used smokeless tobacco. No results for input(s): "HGBA1C", "LABURIC" in the last 8760 hours.  Objective:  VS:  HT:5\' 2"  (157.5 cm)   WT:180 lb (81.6 kg)  BMI:32.91    BP:(!) 145/85  HR:82bpm  TEMP: ( )  RESP:  Physical Exam Vitals and nursing note reviewed.  HENT:     Head: Normocephalic and atraumatic.     Right Ear: External ear normal.     Left Ear: External ear normal.     Nose: Nose normal.     Mouth/Throat:     Mouth: Mucous membranes are moist.  Eyes:     Extraocular Movements: Extraocular movements intact.  Cardiovascular:     Rate and Rhythm: Normal  rate.     Pulses: Normal pulses.  Pulmonary:     Effort: Pulmonary effort is normal.  Abdominal:     General: Abdomen is flat. There is no distension.  Musculoskeletal:        General: Tenderness present.     Cervical back: Normal range of motion.     Comments: Pt rises from seated position to standing without difficulty. Good lumbar range of motion. Strong distal strength without clonus, no pain upon palpation of greater trochanters. Sensation intact bilaterally. Walks independently, gait steady.   Skin:    General: Skin is warm and dry.     Capillary Refill: Capillary refill takes less than 2 seconds.  Neurological:     General: No focal deficit present.     Mental Status: She is alert and oriented to person, place, and time.  Psychiatric:        Mood and Affect: Mood normal.        Behavior: Behavior normal.     Ortho Exam  Imaging: No results  found.  Past Medical/Family/Surgical/Social History: Medications & Allergies reviewed per EMR, new medications updated. Patient Active Problem List   Diagnosis Date Noted   Spinal stenosis of lumbar region with neurogenic claudication 01/17/2016   Lumbar radiculopathy 01/17/2016   History reviewed. No pertinent past medical history. History reviewed. No pertinent family history. History reviewed. No pertinent surgical history. Social History   Occupational History   Not on file  Tobacco Use   Smoking status: Never   Smokeless tobacco: Never  Substance and Sexual Activity   Alcohol use: Not on file   Drug use: Not on file   Sexual activity: Not on file

## 2021-11-12 NOTE — Progress Notes (Unsigned)
Patient presents with bilateral knee pain, right>left, which she thinks is stemming from sciatic nerve pain. When shifts in seat, can feel it pull in right lower back. She denies numbness and tingling, however, feels that knees are weak. Some days are better than others, however, the pain seems to be getting worse. She does have difficulty walking. Patient is taking Celebrex and an occasional muscle relaxer at night with some relief.           Numeric Pain Rating Scale and Functional Assessment Average Pain 1 if sitting, 5 if walking   In the last MONTH (on 0-10 scale) has pain interfered with the following?  1. General activity like being  able to carry out your everyday physical activities such as walking, climbing stairs, carrying groceries, or moving a chair?  Rating(5)   +Driver, -BT, -Dye Allergies.

## 2021-11-21 ENCOUNTER — Telehealth: Payer: Self-pay | Admitting: Physical Medicine and Rehabilitation

## 2021-11-21 ENCOUNTER — Other Ambulatory Visit: Payer: Self-pay | Admitting: Physical Medicine and Rehabilitation

## 2021-11-21 MED ORDER — CELECOXIB 200 MG PO CAPS: 200.0000 mg | ORAL_CAPSULE | Freq: Every day | ORAL | 0 refills | Status: AC

## 2021-11-21 NOTE — Telephone Encounter (Signed)
Pt called and states she needs a refill on celebrex.   CB [phone]

## 2021-12-03 ENCOUNTER — Ambulatory Visit (INDEPENDENT_AMBULATORY_CARE_PROVIDER_SITE_OTHER): Payer: 59 | Admitting: Physical Medicine and Rehabilitation

## 2021-12-03 ENCOUNTER — Ambulatory Visit: Payer: Self-pay

## 2021-12-03 VITALS — BP 158/79 | HR 96

## 2021-12-03 DIAGNOSIS — M5416 Radiculopathy, lumbar region: Secondary | ICD-10-CM | POA: Diagnosis not present

## 2021-12-03 MED ORDER — METHYLPREDNISOLONE ACETATE 80 MG/ML IJ SUSP
40.0000 mg | Freq: Once | INTRAMUSCULAR | Status: AC
  Administered 2021-12-03: 40 mg

## 2021-12-03 NOTE — Progress Notes (Unsigned)
Patient is having pain down the right leg to the knee. She is also experiencing pain medial left knee, which she notices as stiffness when she gets up to walk.  She is having tingling down the right leg and in the calf. She takes celebrex with some relief.  Numeric Pain Rating Scale and Functional Assessment Average Pain 4   In the last MONTH (on 0-10 scale) has pain interfered with the following?  1. General activity like being  able to carry out your everyday physical activities such as walking, climbing stairs, carrying groceries, or moving a chair?  Rating-10 for excessive walking, 0 sedentary   +Driver, -BT, -Dye Allergies.

## 2021-12-03 NOTE — Patient Instructions (Signed)

## 2021-12-06 NOTE — Procedures (Signed)
Lumbosacral Transforaminal Epidural Steroid Injection - Sub-Pedicular Approach with Fluoroscopic Guidance  [name]      Date of Birth: 12-31-1950 MRN: [id number] PCP: Thressa Sheller, MD (Inactive)      Visit Date: 12/03/2021   Universal Protocol:    Date/Time: 12/03/2021  Consent Given By: the patient  Position: PRONE  Additional Comments: Vital signs were monitored before and after the procedure. Patient was prepped and draped in the usual sterile fashion. The correct patient, procedure, and site was verified.   Injection Procedure Details:   Procedure diagnoses: Lumbar radiculopathy [M54.16]    Meds Administered:  Meds ordered this encounter  Medications   methylPREDNISolone acetate (DEPO-MEDROL) injection 40 mg    Laterality: Right  Location/Site: L4 and L5  Needle:5.0 in., 22 ga.  Short bevel or Quincke spinal needle  Needle Placement: Transforaminal  Findings:    -Comments: Excellent flow of contrast along the nerve, nerve root and into the epidural space.  Procedure Details: After squaring off the end-plates to get a true AP view, the C-arm was positioned so that an oblique view of the foramen as noted above was visualized. The target area is just inferior to the "nose of the scotty dog" or sub pedicular. The soft tissues overlying this structure were infiltrated with 2-3 ml. of 1% Lidocaine without Epinephrine.  The spinal needle was inserted toward the target using a "trajectory" view along the fluoroscope beam.  Under AP and lateral visualization, the needle was advanced so it did not puncture dura and was located close the 6 O'Clock position of the pedical in AP tracterory. Biplanar projections were used to confirm position. Aspiration was confirmed to be negative for CSF and/or blood. A 1-2 ml. volume of Isovue-250 was injected and flow of contrast was noted at each level. Radiographs were obtained for documentation purposes.   After attaining  the desired flow of contrast documented above, a 0.5 to 1.0 ml test dose of 0.25% Marcaine was injected into each respective transforaminal space.  The patient was observed for 90 seconds post injection.  After no sensory deficits were reported, and normal lower extremity motor function was noted,   the above injectate was administered so that equal amounts of the injectate were placed at each foramen (level) into the transforaminal epidural space.   Additional Comments:  The patient tolerated the procedure well Dressing: 2 x 2 sterile gauze and Band-Aid    Post-procedure details: Patient was observed during the procedure. Post-procedure instructions were reviewed.  Patient left the clinic in stable condition.

## 2021-12-06 NOTE — Progress Notes (Signed)
Rachel Burns [facility] - 71 y.o. female MRN [id number]  Date of birth: 02-04-1951  Office Visit Note: Visit Date: 12/03/2021 PCP: Thressa Sheller, MD (Inactive) Referred by: Lorine Bears, NP  Subjective: Chief Complaint  Patient presents with   Lower Back - Pain   HPI:  Rachel Burns is a 71 y.o. female who comes in today at the request of Barnet Pall, FNP for planned Right L4-5 and L5-S1 Lumbar Transforaminal epidural steroid injection with fluoroscopic guidance.  The patient has failed conservative care including home exercise, medications, time and activity modification.  This injection will be diagnostic and hopefully therapeutic.  Please see requesting physician notes for further details and justification.   ROS Otherwise per HPI.  Assessment & Plan: Visit Diagnoses:    ICD-10-CM   1. Lumbar radiculopathy  M54.16 XR C-ARM NO REPORT    Epidural Steroid injection    methylPREDNISolone acetate (DEPO-MEDROL) injection 40 mg      Plan: No additional findings.   Meds & Orders:  Meds ordered this encounter  Medications   methylPREDNISolone acetate (DEPO-MEDROL) injection 40 mg    Orders Placed This Encounter  Procedures   XR C-ARM NO REPORT   Epidural Steroid injection    Follow-up: Return for visit to requesting provider as needed.   Procedures: No procedures performed  Lumbosacral Transforaminal Epidural Steroid Injection - Sub-Pedicular Approach with Fluoroscopic Guidance  [name]      Date of Birth: 1950/04/19 MRN: [id number] PCP: Thressa Sheller, MD (Inactive)      Visit Date: 12/03/2021   Universal Protocol:    Date/Time: 12/03/2021  Consent Given By: the patient  Position: PRONE  Additional Comments: Vital signs were monitored before and after the procedure. Patient was prepped and draped in the usual sterile fashion. The correct patient, procedure, and site was verified.   Injection Procedure Details:   Procedure diagnoses: Lumbar  radiculopathy [M54.16]    Meds Administered:  Meds ordered this encounter  Medications   methylPREDNISolone acetate (DEPO-MEDROL) injection 40 mg    Laterality: Right  Location/Site: L4 and L5  Needle:5.0 in., 22 ga.  Short bevel or Quincke spinal needle  Needle Placement: Transforaminal  Findings:    -Comments: Excellent flow of contrast along the nerve, nerve root and into the epidural space.  Procedure Details: After squaring off the end-plates to get a true AP view, the C-arm was positioned so that an oblique view of the foramen as noted above was visualized. The target area is just inferior to the "nose of the scotty dog" or sub pedicular. The soft tissues overlying this structure were infiltrated with 2-3 ml. of 1% Lidocaine without Epinephrine.  The spinal needle was inserted toward the target using a "trajectory" view along the fluoroscope beam.  Under AP and lateral visualization, the needle was advanced so it did not puncture dura and was located close the 6 O'Clock position of the pedical in AP tracterory. Biplanar projections were used to confirm position. Aspiration was confirmed to be negative for CSF and/or blood. A 1-2 ml. volume of Isovue-250 was injected and flow of contrast was noted at each level. Radiographs were obtained for documentation purposes.   After attaining the desired flow of contrast documented above, a 0.5 to 1.0 ml test dose of 0.25% Marcaine was injected into each respective transforaminal space.  The patient was observed for 90 seconds post injection.  After no sensory deficits were reported, and normal lower extremity motor function was noted,  the above injectate was administered so that equal amounts of the injectate were placed at each foramen (level) into the transforaminal epidural space.   Additional Comments:  The patient tolerated the procedure well Dressing: 2 x 2 sterile gauze and Band-Aid    Post-procedure details: Patient was  observed during the procedure. Post-procedure instructions were reviewed.  Patient left the clinic in stable condition.    Clinical History: MRI LUMBAR SPINE WITHOUT CONTRAST   TECHNIQUE:  Multiplanar, multisequence MR imaging of the lumbar spine was  performed. No intravenous contrast was administered.   COMPARISON: None.   FINDINGS:  Segmentation: 5 lumbar type vertebral bodies present.   Alignment: No scoliotic curvature.   Vertebrae: No primary bone lesion.   Conus medullaris: Extends to the L1 level and appears normal.   Paraspinal and other soft tissues: No significant finding.   Disc levels: No abnormality at T12-L1, L1-2, L2-3 for L3-4.   L4-5: Advanced bilateral facet arthropathy with moderate edematous  change. Anterolisthesis of 7 mm. Focal right foraminal to extra  foraminal disc herniation. Nerve compression could occur in the  central canal and lateral recesses. The right L4 nerve root is quite  likely affected by the focal disc herniation.   L5-S1: Bilateral facet arthropathy with anterolisthesis of 3 mm. No  disc bulge or herniation. No compressive stenosis.   IMPRESSION:  L4-5: Advanced bilateral facet arthropathy with 7 mm of  anterolisthesis. Spinal stenosis of the central canal and lateral  recesses that could cause neural compression. Focal right foraminal  to extra foraminal disc herniation likely to focally compress the  right L4 nerve root.   L5-S1: Advanced bilateral facet arthropathy with 3 mm of  anterolisthesis. No compressive stenosis.    Electronically Signed  By: Paulina Fusi M.D.  On: 08/07/2015 21:14     Objective:  VS:  HT:    WT:   BMI:     BP:(!) 158/79  HR:96bpm  TEMP: ( )  RESP:  Physical Exam Vitals and nursing note reviewed.  Constitutional:      General: She is not in acute distress.    Appearance: Normal appearance. She is not ill-appearing.  HENT:     Head: Normocephalic and atraumatic.     Right Ear:  External ear normal.     Left Ear: External ear normal.  Eyes:     Extraocular Movements: Extraocular movements intact.  Cardiovascular:     Rate and Rhythm: Normal rate.     Pulses: Normal pulses.  Pulmonary:     Effort: Pulmonary effort is normal. No respiratory distress.  Abdominal:     General: There is no distension.     Palpations: Abdomen is soft.  Musculoskeletal:        General: Tenderness present.     Cervical back: Neck supple.     Right lower leg: No edema.     Left lower leg: No edema.     Comments: Patient has good distal strength with no pain over the greater trochanters.  No clonus or focal weakness.  Skin:    Findings: No erythema, lesion or rash.  Neurological:     General: No focal deficit present.     Mental Status: She is alert and oriented to person, place, and time.     Sensory: No sensory deficit.     Motor: No weakness or abnormal muscle tone.     Coordination: Coordination normal.  Psychiatric:        Mood and Affect: Mood  normal.        Behavior: Behavior normal.      Imaging: No results found.

## 2021-12-21 ENCOUNTER — Telehealth: Payer: Self-pay | Admitting: Physical Medicine and Rehabilitation

## 2021-12-21 NOTE — Telephone Encounter (Signed)
Patient wants to know if she should get another shot. She is still in pain [phone]

## 2021-12-24 NOTE — Telephone Encounter (Signed)
Also pt states that the right side pain radiates down her leg and the left side feels more in the knee area.

## 2021-12-24 NOTE — Telephone Encounter (Signed)
Called pt to discuss injection. She said injection worked I the beginning about 75%. She states on the right side it was really good pain relief, not 100% but she could walk and everything fine but the left side didn't get enough relief. Now the left side hurts worse than the right side.    [phone]

## 2021-12-25 ENCOUNTER — Encounter: Payer: Self-pay | Admitting: Physical Medicine and Rehabilitation

## 2021-12-25 ENCOUNTER — Ambulatory Visit (INDEPENDENT_AMBULATORY_CARE_PROVIDER_SITE_OTHER): Payer: 59 | Admitting: Physical Medicine and Rehabilitation

## 2021-12-25 DIAGNOSIS — M4726 Other spondylosis with radiculopathy, lumbar region: Secondary | ICD-10-CM | POA: Diagnosis not present

## 2021-12-25 DIAGNOSIS — M48062 Spinal stenosis, lumbar region with neurogenic claudication: Secondary | ICD-10-CM

## 2021-12-25 DIAGNOSIS — M25561 Pain in right knee: Secondary | ICD-10-CM

## 2021-12-25 DIAGNOSIS — M25562 Pain in left knee: Secondary | ICD-10-CM

## 2021-12-25 DIAGNOSIS — M4316 Spondylolisthesis, lumbar region: Secondary | ICD-10-CM | POA: Diagnosis not present

## 2021-12-25 DIAGNOSIS — M5416 Radiculopathy, lumbar region: Secondary | ICD-10-CM

## 2021-12-25 DIAGNOSIS — G8929 Other chronic pain: Secondary | ICD-10-CM

## 2021-12-25 NOTE — Progress Notes (Unsigned)
Rachel Burns [facility] - 71 y.o. female MRN [id number]  Date of birth: December 04, 1950  Office Visit Note: Visit Date: 12/25/2021 PCP: Irena Reichmann, DO Referred by: No ref. provider found  Subjective: Chief Complaint  Patient presents with   Lower Back - Pain   Left Leg - Pain   Right Leg - Pain   HPI: [name] is a 71 y.o. female who comes in today For evaluation of chronic, worsening and severe bilateral lower back pain radiating to legs. Pain ongoing for several years and is exacerbated by prolonged standing and walking. She describes her pain as sore, aching and tingling, currently rates as 7 out of 10. Some relief of pain with home exercise regimen, rest and use of medications. Lumbar MRI imaging from 2017 exhibits advanced bilateral facet arthropathy with 7 mm of anterolisthesis at L4-L5. Spinal stenosis of the central canal and lateral recesses could cause neural compression at this level. History of multiple lumbar epidural steroid injection in our office in 2017 with good relief of pain. Patient recently underwent right L4 and L5 transforaminal epidural steroid injection on 12/03/2021. She reports good relief, greater than 50% of right sided radicular symptoms for 1 week. States pain has increased over the last several weeks, is now bilateral and more severe on the left side. Patient states she would like to try another injection if possible as she does have travel plans coming up. Patient denies focal weakness. Patient denies recent trauma or falls.    Review of Systems  Musculoskeletal:  Positive for back pain.  Neurological:  Positive for tingling. Negative for sensory change, focal weakness and weakness.  All other systems reviewed and are negative.  Otherwise per HPI.  Assessment & Plan: Visit Diagnoses:    ICD-10-CM   1. Lumbar radiculopathy  M54.16 Ambulatory referral to Physical Medicine Rehab    2. Spinal stenosis of lumbar region with neurogenic claudication  M48.062 Ambulatory  referral to Physical Medicine Rehab    3. Anterolisthesis of lumbar spine  M43.16 Ambulatory referral to Physical Medicine Rehab    4. Other spondylosis with radiculopathy, lumbar region  M47.26 Ambulatory referral to Physical Medicine Rehab    5. Chronic pain of both knees  M25.561 Ambulatory referral to Physical Medicine Rehab   [phone]    G89.29        Plan: Findings:  Chronic, worsening and severe bilateral lower back pain radiating to legs. Patient continues to have severe pain despite good conservative therapies such as home exercise regimen, rest and use of medications. Patients clinical presentation and exam are consistent with neurogenic claudication as a result of spinal canal stenosis. There is severe spinal canal stenosis at the level of L4-L5. Next step is to perform diagnostic and hopefully therapeutic bilateral L4 transforaminal epidural steroid injection under fluoroscopic guidance. If patient gets significant and sustained relief of pain with injection we can repeat infrequently as needed. If pain persists post injection we did discuss obtaining new lumbar MRI imaging. We also discussed referral for surgical consultation with Dr. Willia Craze. No red flag symptoms noted upon exam today.     Meds & Orders: No orders of the defined types were placed in this encounter.   Orders Placed This Encounter  Procedures   Ambulatory referral to Physical Medicine Rehab    Follow-up: Return for Bilateral L4 transforaminal epidural steroid injection.   Procedures: No procedures performed      Clinical History: MRI LUMBAR SPINE WITHOUT CONTRAST   TECHNIQUE:  Multiplanar, multisequence MR imaging of the lumbar spine was  performed. No intravenous contrast was administered.   COMPARISON: None.   FINDINGS:  Segmentation: 5 lumbar type vertebral bodies present.   Alignment: No scoliotic curvature.   Vertebrae: No primary bone lesion.   Conus medullaris: Extends to the L1  level and appears normal.   Paraspinal and other soft tissues: No significant finding.   Disc levels: No abnormality at T12-L1, L1-2, L2-3 for L3-4.   L4-5: Advanced bilateral facet arthropathy with moderate edematous  change. Anterolisthesis of 7 mm. Focal right foraminal to extra  foraminal disc herniation. Nerve compression could occur in the  central canal and lateral recesses. The right L4 nerve root is quite  likely affected by the focal disc herniation.   L5-S1: Bilateral facet arthropathy with anterolisthesis of 3 mm. No  disc bulge or herniation. No compressive stenosis.   IMPRESSION:  L4-5: Advanced bilateral facet arthropathy with 7 mm of  anterolisthesis. Spinal stenosis of the central canal and lateral  recesses that could cause neural compression. Focal right foraminal  to extra foraminal disc herniation likely to focally compress the  right L4 nerve root.   L5-S1: Advanced bilateral facet arthropathy with 3 mm of  anterolisthesis. No compressive stenosis.    Electronically Signed  By: Nelson Chimes M.D.  On: 08/07/2015 21:14   She reports that she has never smoked. She has never used smokeless tobacco. No results for input(s): "HGBA1C", "LABURIC" in the last 8760 hours.  Objective:  VS:  HT:    WT:   BMI:     BP:   HR: bpm  TEMP: ( )  RESP:  Physical Exam Vitals and nursing note reviewed.  HENT:     Head: Normocephalic and atraumatic.     Right Ear: External ear normal.     Left Ear: External ear normal.     Nose: Nose normal.     Mouth/Throat:     Mouth: Mucous membranes are moist.  Eyes:     Extraocular Movements: Extraocular movements intact.  Cardiovascular:     Rate and Rhythm: Normal rate.     Pulses: Normal pulses.  Pulmonary:     Effort: Pulmonary effort is normal.  Abdominal:     General: Abdomen is flat. There is no distension.  Musculoskeletal:        General: Tenderness present.     Cervical back: Normal range of motion.      Comments: Pt rises from seated position to standing without difficulty. Good lumbar range of motion. Strong distal strength without clonus, no pain upon palpation of greater trochanters. Sensation intact bilaterally. Walks independently, gait steady.    Skin:    General: Skin is warm and dry.     Capillary Refill: Capillary refill takes less than 2 seconds.  Neurological:     General: No focal deficit present.     Mental Status: She is alert and oriented to person, place, and time.  Psychiatric:        Mood and Affect: Mood normal.        Behavior: Behavior normal.     Ortho Exam  Imaging: No results found.  Past Medical/Family/Surgical/Social History: Medications & Allergies reviewed per EMR, new medications updated. Patient Active Problem List   Diagnosis Date Noted   Spinal stenosis of lumbar region with neurogenic claudication 01/17/2016   Lumbar radiculopathy 01/17/2016   History reviewed. No pertinent past medical history. History reviewed. No pertinent family history. History reviewed. No  pertinent surgical history. Social History   Occupational History   Not on file  Tobacco Use   Smoking status: Never   Smokeless tobacco: Never  Substance and Sexual Activity   Alcohol use: Not on file   Drug use: Not on file   Sexual activity: Not on file

## 2021-12-25 NOTE — Progress Notes (Unsigned)
States after injection 3 weeks ago her right side got a lot better; only lasted about a week though. Pain goes back ad fourth from left to right. Being sedentary is helpful. Any activity causes pain. Both knees still very painful, states she gets confused between back and knees. States this past Friday was horrible, which is why she called. States legs were just aching. States today, she is okay and better than last week. States her pain in constantly changing.

## 2022-01-09 ENCOUNTER — Ambulatory Visit: Payer: Self-pay

## 2022-01-09 ENCOUNTER — Ambulatory Visit (INDEPENDENT_AMBULATORY_CARE_PROVIDER_SITE_OTHER): Payer: 59 | Admitting: Physical Medicine and Rehabilitation

## 2022-01-09 ENCOUNTER — Encounter: Payer: 59 | Admitting: Physical Medicine and Rehabilitation

## 2022-01-09 VITALS — BP 144/88 | HR 94

## 2022-01-09 DIAGNOSIS — M5416 Radiculopathy, lumbar region: Secondary | ICD-10-CM

## 2022-01-09 MED ORDER — METHYLPREDNISOLONE ACETATE 80 MG/ML IJ SUSP
40.0000 mg | Freq: Once | INTRAMUSCULAR | Status: AC
  Administered 2022-01-09: 40 mg

## 2022-01-09 NOTE — Patient Instructions (Signed)

## 2022-01-09 NOTE — Progress Notes (Signed)
Numeric Pain Rating Scale and Functional Assessment Average Pain 1   In the last MONTH (on 0-10 scale) has pain interfered with the following?  1. General activity like being  able to carry out your everyday physical activities such as walking, climbing stairs, carrying groceries, or moving a chair?  Rating( Can range from 3-6 )   +Driver, -BT, -Dye Allergies.  Walking and standing makes pain worse. Pain on both side,but worse on right now. Pain can radiate down right leg to ankle at times, but mostly to the knee. Legs ache when standing and walking for long periods of time

## 2022-01-22 NOTE — Progress Notes (Signed)
Rachel Burns Rachel Burns - 71 y.o. female MRN [id number]  Date of birth: [birth year]  Office Visit Note: Visit Date: 01/09/2022 PCP: Irena Reichmann, DO Referred by: Irena Reichmann, DO  Subjective: Chief Complaint  Patient presents with   Lower Back - Pain   HPI:  Rachel Burns is a 71 y.o. female who comes in today at the request of Ellin Goodie, FNP for planned Bilateral L4-5 Lumbar Transforaminal epidural steroid injection with fluoroscopic guidance.  The patient has failed conservative care including home exercise, medications, time and activity modification.  This injection will be diagnostic and hopefully therapeutic.  Please see requesting physician notes for further details and justification.   ROS Otherwise per HPI.  Assessment & Plan: Visit Diagnoses:    ICD-10-CM   1. Lumbar radiculopathy  M54.16 XR C-ARM NO REPORT    Epidural Steroid injection    methylPREDNISolone acetate (DEPO-MEDROL) injection 40 mg      Plan: No additional findings.   Meds & Orders:  Meds ordered this encounter  Medications   methylPREDNISolone acetate (DEPO-MEDROL) injection 40 mg    Orders Placed This Encounter  Procedures   XR C-ARM NO REPORT   Epidural Steroid injection    Follow-up: Return if symptoms worsen or fail to improve.   Procedures: No procedures performed  Lumbosacral Transforaminal Epidural Steroid Injection - Sub-Pedicular Approach with Fluoroscopic Guidance  [name]      Date of Birth: Aug 01, 1950 MRN: [id number] PCP: Irena Reichmann, DO      Visit Date: 01/09/2022   Universal Protocol:    Date/Time: 01/09/2022  Consent Given By: the patient  Position: PRONE  Additional Comments: Vital signs were monitored before and after the procedure. Patient was prepped and draped in the usual sterile fashion. The correct patient, procedure, and site was verified.   Injection Procedure Details:   Procedure diagnoses: Lumbar radiculopathy [M54.16]    Meds Administered:   Meds ordered this encounter  Medications   methylPREDNISolone acetate (DEPO-MEDROL) injection 40 mg    Laterality: Bilateral  Location/Site: L4  Needle:5.0 in., 22 ga.  Short bevel or Quincke spinal needle  Needle Placement: Transforaminal  Findings:    -Comments: Excellent flow of contrast along the nerve, nerve root and into the epidural space.  Procedure Details: After squaring off the end-plates to get a true AP view, the C-arm was positioned so that an oblique view of the foramen as noted above was visualized. The target area is just inferior to the "nose of the scotty dog" or sub pedicular. The soft tissues overlying this structure were infiltrated with 2-3 ml. of 1% Lidocaine without Epinephrine.  The spinal needle was inserted toward the target using a "trajectory" view along the fluoroscope beam.  Under AP and lateral visualization, the needle was advanced so it did not puncture dura and was located close the 6 O'Clock position of the pedical in AP tracterory. Biplanar projections were used to confirm position. Aspiration was confirmed to be negative for CSF and/or blood. A 1-2 ml. volume of Isovue-250 was injected and flow of contrast was noted at each level. Radiographs were obtained for documentation purposes.   After attaining the desired flow of contrast documented above, a 0.5 to 1.0 ml test dose of 0.25% Marcaine was injected into each respective transforaminal space.  The patient was observed for 90 seconds post injection.  After no sensory deficits were reported, and normal lower extremity motor function was noted,   the above injectate was administered so  that equal amounts of the injectate were placed at each foramen (level) into the transforaminal epidural space.   Additional Comments:  The patient tolerated the procedure well Dressing: 2 x 2 sterile gauze and Band-Aid    Post-procedure details: Patient was observed during the procedure. Post-procedure  instructions were reviewed.  Patient left the clinic in stable condition.    Clinical History: MRI LUMBAR SPINE WITHOUT CONTRAST   TECHNIQUE:  Multiplanar, multisequence MR imaging of the lumbar spine was  performed. No intravenous contrast was administered.   COMPARISON: None.   FINDINGS:  Segmentation: 5 lumbar type vertebral bodies present.   Alignment: No scoliotic curvature.   Vertebrae: No primary bone lesion.   Conus medullaris: Extends to the L1 level and appears normal.   Paraspinal and other soft tissues: No significant finding.   Disc levels: No abnormality at T12-L1, L1-2, L2-3 for L3-4.   L4-5: Advanced bilateral facet arthropathy with moderate edematous  change. Anterolisthesis of 7 mm. Focal right foraminal to extra  foraminal disc herniation. Nerve compression could occur in the  central canal and lateral recesses. The right L4 nerve root is quite  likely affected by the focal disc herniation.   L5-S1: Bilateral facet arthropathy with anterolisthesis of 3 mm. No  disc bulge or herniation. No compressive stenosis.   IMPRESSION:  L4-5: Advanced bilateral facet arthropathy with 7 mm of  anterolisthesis. Spinal stenosis of the central canal and lateral  recesses that could cause neural compression. Focal right foraminal  to extra foraminal disc herniation likely to focally compress the  right L4 nerve root.   L5-S1: Advanced bilateral facet arthropathy with 3 mm of  anterolisthesis. No compressive stenosis.    Electronically Signed  By: Paulina Fusi M.D.  On: 08/07/2015 21:14     Objective:  VS:  HT:    WT:   BMI:     BP:(!) 144/88  HR:94bpm  TEMP: ( )  RESP:  Physical Exam Vitals and nursing note reviewed.  Constitutional:      General: She is not in acute distress.    Appearance: Normal appearance. She is not ill-appearing.  HENT:     Head: Normocephalic and atraumatic.     Right Ear: External ear normal.     Left Ear: External ear  normal.  Eyes:     Extraocular Movements: Extraocular movements intact.  Cardiovascular:     Rate and Rhythm: Normal rate.     Pulses: Normal pulses.  Pulmonary:     Effort: Pulmonary effort is normal. No respiratory distress.  Abdominal:     General: There is no distension.     Palpations: Abdomen is soft.  Musculoskeletal:        General: Tenderness present.     Cervical back: Neck supple.     Right lower leg: No edema.     Left lower leg: No edema.     Comments: Patient has good distal strength with no pain over the greater trochanters.  No clonus or focal weakness.  Skin:    Findings: No erythema, lesion or rash.  Neurological:     General: No focal deficit present.     Mental Status: She is alert and oriented to person, place, and time.     Sensory: No sensory deficit.     Motor: No weakness or abnormal muscle tone.     Coordination: Coordination normal.  Psychiatric:        Mood and Affect: Mood normal.  Behavior: Behavior normal.      Imaging: No results found.

## 2022-01-22 NOTE — Procedures (Signed)
Lumbosacral Transforaminal Epidural Steroid Injection - Sub-Pedicular Approach with Fluoroscopic Guidance  [name]      Date of Birth: 08-15-1950 MRN: [id number] PCP: Irena Reichmann, DO      Visit Date: 01/09/2022   Universal Protocol:    Date/Time: 01/09/2022  Consent Given By: the patient  Position: PRONE  Additional Comments: Vital signs were monitored before and after the procedure. Patient was prepped and draped in the usual sterile fashion. The correct patient, procedure, and site was verified.   Injection Procedure Details:   Procedure diagnoses: Lumbar radiculopathy [M54.16]    Meds Administered:  Meds ordered this encounter  Medications   methylPREDNISolone acetate (DEPO-MEDROL) injection 40 mg    Laterality: Bilateral  Location/Site: L4  Needle:5.0 in., 22 ga.  Short bevel or Quincke spinal needle  Needle Placement: Transforaminal  Findings:    -Comments: Excellent flow of contrast along the nerve, nerve root and into the epidural space.  Procedure Details: After squaring off the end-plates to get a true AP view, the C-arm was positioned so that an oblique view of the foramen as noted above was visualized. The target area is just inferior to the "nose of the scotty dog" or sub pedicular. The soft tissues overlying this structure were infiltrated with 2-3 ml. of 1% Lidocaine without Epinephrine.  The spinal needle was inserted toward the target using a "trajectory" view along the fluoroscope beam.  Under AP and lateral visualization, the needle was advanced so it did not puncture dura and was located close the 6 O'Clock position of the pedical in AP tracterory. Biplanar projections were used to confirm position. Aspiration was confirmed to be negative for CSF and/or blood. A 1-2 ml. volume of Isovue-250 was injected and flow of contrast was noted at each level. Radiographs were obtained for documentation purposes.   After attaining the desired flow  of contrast documented above, a 0.5 to 1.0 ml test dose of 0.25% Marcaine was injected into each respective transforaminal space.  The patient was observed for 90 seconds post injection.  After no sensory deficits were reported, and normal lower extremity motor function was noted,   the above injectate was administered so that equal amounts of the injectate were placed at each foramen (level) into the transforaminal epidural space.   Additional Comments:  The patient tolerated the procedure well Dressing: 2 x 2 sterile gauze and Band-Aid    Post-procedure details: Patient was observed during the procedure. Post-procedure instructions were reviewed.  Patient left the clinic in stable condition.

## 2022-02-13 ENCOUNTER — Telehealth: Payer: Self-pay | Admitting: Physical Medicine and Rehabilitation

## 2022-02-13 ENCOUNTER — Other Ambulatory Visit: Payer: Self-pay | Admitting: Physical Medicine and Rehabilitation

## 2022-02-13 DIAGNOSIS — M5416 Radiculopathy, lumbar region: Secondary | ICD-10-CM

## 2022-02-13 DIAGNOSIS — M48062 Spinal stenosis, lumbar region with neurogenic claudication: Secondary | ICD-10-CM

## 2022-02-13 NOTE — Telephone Encounter (Signed)
Pt called requesting a call from Dr Alvester Morin about her pains in back after her injections. Please call pt at [phone]

## 2022-02-13 NOTE — Telephone Encounter (Signed)
Spoke with patient and she stated she is having back pains again. The last injection was 01/09/22 and lasted a couple weeks. During those weeks, she had 75% improvement. She states the left side is considerably better, but the right is giving her a problem. She has no new injury, falls or accidents. She stated getting an MRI was an option and she is ready to get it. Please advise

## 2022-03-05 ENCOUNTER — Other Ambulatory Visit: Payer: 59

## 2022-03-06 ENCOUNTER — Telehealth: Payer: Self-pay | Admitting: Physical Medicine and Rehabilitation

## 2022-03-06 NOTE — Telephone Encounter (Signed)
Pt called requesting a call back from [place] [employer] about her approval from Dr Ernestina Patches for MRI. Please call pt at [phone]

## 2022-03-08 NOTE — Telephone Encounter (Signed)
I called the patient to give her an update on the status of the referral. Either reconsideration or p2p will need to happen, most likely. Will keep her informed.

## 2022-03-18 NOTE — Telephone Encounter (Signed)
I called and advised the patient of the approval from the insurance company. Advised her to give [facility] a call to get back on the schedule (they are not in the office today, though).

## 2022-03-31 ENCOUNTER — Ambulatory Visit
Admission: RE | Admit: 2022-03-31 | Discharge: 2022-03-31 | Disposition: A | Discharge status: Home / Self Care | Payer: 59 | Source: Ambulatory Visit | Attending: Physical Medicine and Rehabilitation | Admitting: Physical Medicine and Rehabilitation

## 2022-03-31 DIAGNOSIS — M48062 Spinal stenosis, lumbar region with neurogenic claudication: Secondary | ICD-10-CM

## 2022-03-31 DIAGNOSIS — M5416 Radiculopathy, lumbar region: Secondary | ICD-10-CM

## 2022-04-08 ENCOUNTER — Encounter: Payer: Self-pay | Admitting: Physical Medicine and Rehabilitation

## 2022-04-08 ENCOUNTER — Ambulatory Visit (INDEPENDENT_AMBULATORY_CARE_PROVIDER_SITE_OTHER): Payer: 59 | Admitting: Physical Medicine and Rehabilitation

## 2022-04-08 DIAGNOSIS — M4726 Other spondylosis with radiculopathy, lumbar region: Secondary | ICD-10-CM

## 2022-04-08 DIAGNOSIS — M47816 Spondylosis without myelopathy or radiculopathy, lumbar region: Secondary | ICD-10-CM

## 2022-04-08 DIAGNOSIS — M48062 Spinal stenosis, lumbar region with neurogenic claudication: Secondary | ICD-10-CM

## 2022-04-08 DIAGNOSIS — M5416 Radiculopathy, lumbar region: Secondary | ICD-10-CM

## 2022-04-08 NOTE — Progress Notes (Unsigned)
Rachel Burns [facility] - 72 y.o. female MRN [id number]  Date of birth: [birth year]  Office Visit Note: Visit Date: 04/08/2022 PCP: Janie Morning, DO Referred by: Janie Morning, DO  Subjective: Chief Complaint  Patient presents with   Lower Back - Pain   HPI: Rachel Burns is a 72 y.o. female who comes in today for evaluation of chronic, worsening and severe bilateral lower back pain radiating down legs, right greater than left. Pain ongoing for several years and is exacerbated by prolonged standing and walking. States standing causes most severe discomfort, she reports having to lean over cart when grocery shopping. She does have to take frequent breaks when walking. She describes her pain as sore, aching and tingling sensation, currently rates as 8 out of 10. Some relief of pain with home exercise regimen, rest and use of medications. Recent lumbar MRI imaging exhibits multilevel facet arthropathy, most severe at L4-L5 where there is anterolisthesis and severe spinal canal stenosis. There is also facet degeneration and anterolisthesis noted at L5-S1.  Patient has undergone multiple lumbar injections in our office with good short term relief of pain. Most recent was bilateral L4 transforaminal epidural steroid injection on 01/09/2022, she reports greater than 80% relief of pain for several weeks. Patient feels her pain has significantly increased over the last 6 months, she is now having difficulty with daily tasks such as cooking and cleaning due to severe discomfort. States she feels her ability to function and quality of life have significantly decreased recently.  Patient denies focal weakness.  No recent trauma or falls.   Review of Systems  Musculoskeletal:  Positive for back pain.  Neurological:  Positive for tingling. Negative for focal weakness and weakness.  All other systems reviewed and are negative.  Otherwise per HPI.  Assessment & Plan: Visit Diagnoses:    ICD-10-CM   1. Lumbar  radiculopathy  M54.16 Ambulatory referral to Physical Medicine Rehab    Ambulatory referral to Orthopedic Surgery    2. Other spondylosis with radiculopathy, lumbar region  M47.26 Ambulatory referral to Physical Medicine Rehab    Ambulatory referral to Orthopedic Surgery    3. Spinal stenosis of lumbar region with neurogenic claudication  M48.062 Ambulatory referral to Physical Medicine Rehab    Ambulatory referral to Orthopedic Surgery    4. Facet hypertrophy of lumbar region  M47.816 Ambulatory referral to Physical Medicine Rehab    Ambulatory referral to Orthopedic Surgery       Plan: Findings:  Chronic, worsening and severe bilateral lower back pain radiating down legs, right greater than left.  Patient continues to have severe pain despite good conservative therapies such as home exercise regimen, rest and use of medications.  I did review recent lumbar MRI with patient today using imaging and spine model.  Patient's clinical presentation and exam are consistent with neurogenic claudication as result of spinal canal stenosis.  There is severe spinal canal stenosis at the level of L4-L5.  We did discuss treatment options in detail with patient today.  Next step is to repeat bilateral L4 transforaminal epidural steroid injection under fluoroscopic guidance.  I also placed an order for surgical consultation with our spine surgeon Dr. Ileene Rubens.  Patient is certainly considering surgical intervention at this time, however she is concerned she will need a lumbar fusion.  I did reassure patient that Dr. Laurance Flatten would provide her with more details and also give her an opportunity to ask questions.  We could look at medication management in  the future, she has not tried any type of neuromodulating medication such as Gabapentin or Lyrica.  Patient is agreeable with plan we will get her in quickly for injection.  No red flag symptoms noted upon exam today.     Meds & Orders: No orders of the defined  types were placed in this encounter.   Orders Placed This Encounter  Procedures   Ambulatory referral to Physical Medicine Rehab   Ambulatory referral to Orthopedic Surgery    Follow-up: Return for Bilateral L4 transforaminal epidural steroid injection.   Procedures: No procedures performed      Clinical History: EXAM: MRI LUMBAR SPINE WITHOUT CONTRAST   TECHNIQUE: Multiplanar, multisequence MR imaging of the lumbar spine was performed. No intravenous contrast was administered.   COMPARISON:  08/07/2015 report, images not available   FINDINGS: Segmentation:  Standard.   Alignment: Degenerative anterolisthesis at L4-5 measuring 9 mm. Milder L5-S1 anterolisthesis due to the same.   Vertebrae:  No fracture, evidence of discitis, or bone lesion.   Conus medullaris and cauda equina: Conus extends to the L1-2 level. Conus and cauda equina appear normal.   Paraspinal and other soft tissues: Negative for perispinal mass or inflammation.   Disc levels:   L2-L3: Mild foraminal disc bulging   L3-L4: Degenerative facet spurring on both sides. Posterior epidural fat expansion with mild thecal sac stenosis.   L4-L5: Severe facet osteoarthritis with spurring and anterolisthesis. The disc is narrowed and bulging with biforaminal herniation. Advanced spinal and biforaminal stenosis.   L5-S1:Facet degeneration with spurring and anterolisthesis. Disc space narrowing and bulging without neural compression.   IMPRESSION: 1. 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 2. Advanced spinal and biforaminal stenosis at L4-5.     Electronically Signed   By: Jorje Guild M.D.   On: 04/02/2022 04:29   She reports that she has never smoked. She has never used smokeless tobacco. No results for input(s): "HGBA1C", "LABURIC" in the last 8760 hours.  Objective:  VS:  HT:    WT:   BMI:     BP:   HR: bpm  TEMP: ( )   RESP:  Physical Exam Vitals and nursing note reviewed.  HENT:     Head: Normocephalic and atraumatic.     Right Ear: External ear normal.     Left Ear: External ear normal.     Nose: Nose normal.     Mouth/Throat:     Mouth: Mucous membranes are moist.  Eyes:     Extraocular Movements: Extraocular movements intact.  Cardiovascular:     Rate and Rhythm: Normal rate.     Pulses: Normal pulses.  Pulmonary:     Effort: Pulmonary effort is normal.  Abdominal:     General: Abdomen is flat. There is no distension.  Musculoskeletal:        General: Tenderness present.     Cervical back: Normal range of motion.     Comments: Pt rises from seated position to standing without difficulty. Good lumbar range of motion. Strong distal strength without clonus, no pain upon palpation of greater trochanters. Sensation intact bilaterally. Walks independently, gait steady.   Skin:    General: Skin is warm and dry.     Capillary Refill: Capillary refill takes less than 2 seconds.  Neurological:     General: No focal deficit present.     Mental Status: She is alert and oriented to person, place, and time.  Psychiatric:  Mood and Affect: Mood normal.        Behavior: Behavior normal.     Ortho Exam  Imaging: No results found.  Past Medical/Family/Surgical/Social History: Medications & Allergies reviewed per EMR, new medications updated. Patient Active Problem List   Diagnosis Date Noted   Spinal stenosis of lumbar region with neurogenic claudication 01/17/2016   Lumbar radiculopathy 01/17/2016   History reviewed. No pertinent past medical history. History reviewed. No pertinent family history. History reviewed. No pertinent surgical history. Social History   Occupational History   Not on file  Tobacco Use   Smoking status: Never   Smokeless tobacco: Never  Substance and Sexual Activity   Alcohol use: Not on file   Drug use: Not on file   Sexual activity: Not on file

## 2022-04-08 NOTE — Progress Notes (Unsigned)
Functional Pain Scale - descriptive words and definitions  Distracting (5)    Aware of pain/able to complete some ADL's but limited by pain/sleep is affected and active distractions are only slightly useful. Moderate range order  Average Pain  varies  Lower back pain. Standing and cleaning can make pain worse. MRI review

## 2022-04-09 NOTE — Progress Notes (Signed)
   04/09/22 0810  [place] in the past year? 0  Number of falls in past year 0  Was there an injury with Fall? 0  Fall Risk Category Calculator 0  Fall Risk  Patient at Risk for Falls Due to No Fall Risks

## 2022-04-17 ENCOUNTER — Encounter: Payer: 59 | Admitting: Physical Medicine and Rehabilitation

## 2022-04-23 ENCOUNTER — Encounter: Payer: Self-pay | Admitting: Orthopedic Surgery

## 2022-04-23 ENCOUNTER — Ambulatory Visit (INDEPENDENT_AMBULATORY_CARE_PROVIDER_SITE_OTHER): Payer: 59 | Admitting: Orthopedic Surgery

## 2022-04-23 ENCOUNTER — Ambulatory Visit (INDEPENDENT_AMBULATORY_CARE_PROVIDER_SITE_OTHER): Payer: 59

## 2022-04-23 VITALS — BP 139/81 | HR 96 | Ht 62.0 in | Wt 180.0 lb

## 2022-04-23 DIAGNOSIS — M5416 Radiculopathy, lumbar region: Secondary | ICD-10-CM | POA: Diagnosis not present

## 2022-04-23 MED ORDER — PREGABALIN 50 MG PO CAPS: 50.0000 mg | ORAL_CAPSULE | Freq: Three times a day (TID) | ORAL | 0 refills | Status: DC

## 2022-04-23 NOTE — Progress Notes (Signed)
Orthopedic Spine Surgery Office Note  Assessment: Patient is a 72 y.o. female with bilateral leg pain.  Pain is worse with standing or activity and improves with sitting.  MRI shows stenosis at L3/4 and L4/5.  Her symptoms and imaging are consistent with neurogenic claudication   Plan: -Explained that initially conservative treatment is tried as a significant number of patients may experience relief with these treatment modalities. Discussed that the conservative treatments include:  -activity modification  -physical therapy  -over the counter pain medications  -medrol dosepak  -lumbar steroid injections -Patient has tried injections, Tylenol, NSAIDs, physical therapy, activity modification -Since she has tried all conservative treatments without significant relief, discussed surgery as an option for her.  She has an injection scheduled with Dr. Ernestina Patches and was interested in trying that again along with Lyrica.  Prescription was provided to her for Lyrica. -Since her spondylolisthesis changes 2 mm between flexion-extension views, she has 1.1 mm of T2 signal change within the L4-5 facet, and has minimal back pain, we discussed decompression alone at L4/5 as a treatment option -Will need to review or get an A1c and make sure that it is under 7.5 before proceeding with any surgical intervention -Patient should return to office in 5 weeks, x-rays at next visit: none   Patient expressed understanding of the plan and all questions were answered to the patient's satisfaction.   ___________________________________________________________________________   History:  Patient is a 72 y.o. female who presents today for lumbar spine.  Patient has had 6 years of bilateral leg pain.  There is no trauma or injury that brought on the pain.  Pain radiates into her bilateral thighs, bilateral knees, and right buttock.  Her symptoms are more severe on the right side and often feels like burning on that side.   There is no trauma or injury that brought on the pain.  Her pain has gotten significantly worse since September 2023.  There is no injury at that time that made the pain worse.  She has tried injections and has gotten temporary relief with time, but has noticed that the are becoming less effective.  She notices her pain when she is walking or standing.  She has had to change how she does household activities in order to adjust the pain.  She says she can only clean or do dishes for 15 minutes and then has to sit down.  She states that she can only walk a block or 2 before pain starts.  She does note that if she sits down or leans forward her pain is better.   Weakness: Denies Symptoms of imbalance: Denies Paresthesias and numbness: Denies Bowel or bladder incontinence: Denies Saddle anesthesia: Denies  Treatments tried: injections, Tylenol, NSAIDs, physical therapy, activity modification  Review of systems: Denies fevers and chills, night sweats, unexplained weight loss, history of cancer, pain that wakes them at night  Past medical history: Hypertension GERD Diabetes Chronic pain  Allergies: Penicillin  Past surgical history:  Cholecystectomy C-section Forearm fracture ORIF  Social history: Denies use of nicotine product (smoking, vaping, patches, smokeless) Alcohol use: 1 drink per week Denies recreational drug use   Physical Exam:  BMI of 32.9  General: no acute distress, appears stated age Neurologic: alert, answering questions appropriately, following commands Respiratory: unlabored breathing on room air, symmetric chest rise Psychiatric: appropriate affect, normal cadence to speech   MSK (spine):  -Strength exam      Left  Right EHL    5/5  5/5  TA    5/5  5/5 GSC    5/5  5/5 Knee extension  5/5  5/5 Hip flexion   5/5  5/5  -Sensory exam    Sensation intact to light touch in L3-S1 nerve distributions of bilateral lower extremities  -Achilles DTR: 1/4 on  the left, 1/4 on the right -Patellar tendon DTR: 1/4 on the left, 1/4 on the right  -Straight leg raise: Negative bilaterally -Femoral nerve stretch test: Negative bilaterally -Clonus: no beats bilaterally  -Left hip exam: No pain through range of motion, negative Stinchfield, negative FABER -Right hip exam: No pain through range of motion, negative Stinchfield, negative FABER  Imaging: XR of the lumbar spine from 04/23/2022 was independently reviewed and interpreted, showing spondylolisthesis at L4/5 - shifts about 66m between flexion/extension.   MRI of the lumbar spine from 03/31/2022 was independently reviewed and interpreted, showing DDD at L4/5. Spondylolisthesis seen at L4/5.  T2 signal within the left facet joint at L4-5 measures 1.1 mm.  Central and lateral recess stenosis at L4/5.  Lateral recess stenosis at L3/4.    Patient name: Rachel LEMOINEPatient MRN: [id number] of visit: 04/23/22

## 2022-04-25 ENCOUNTER — Ambulatory Visit: Payer: Self-pay

## 2022-04-25 ENCOUNTER — Ambulatory Visit (INDEPENDENT_AMBULATORY_CARE_PROVIDER_SITE_OTHER): Payer: 59 | Admitting: Physical Medicine and Rehabilitation

## 2022-04-25 VITALS — BP 162/78 | HR 101

## 2022-04-25 DIAGNOSIS — M5416 Radiculopathy, lumbar region: Secondary | ICD-10-CM

## 2022-04-25 MED ORDER — METHYLPREDNISOLONE ACETATE 80 MG/ML IJ SUSP
80.0000 mg | Freq: Once | INTRAMUSCULAR | Status: AC
  Administered 2022-04-25: 80 mg

## 2022-04-25 NOTE — Patient Instructions (Signed)

## 2022-04-25 NOTE — Progress Notes (Signed)
Functional Pain Scale - descriptive words and definitions  Mild (2)   Noticeable when not distracted/no impact on ADL's/sleep only slightly affected and able to   use both passive and active distraction for comfort. Mild range order  Average Pain  varies   +Driver, -BT, -Dye Allergies.  Lower back pain on both sides that radiates to the left knee and down the right leg to the foot

## 2022-05-01 NOTE — Progress Notes (Signed)
Rachel Burns [facility] - 72 y.o. female MRN [id number]  Date of birth: 06-27-1950  Office Visit Note: Visit Date: 04/25/2022 PCP: Janie Morning, DO Referred by: Janie Morning, DO  Subjective: Chief Complaint  Patient presents with   Lower Back - Pain   HPI:  Rachel Burns is a 72 y.o. female who comes in today at the request of Barnet Pall, FNP for planned Bilateral L4-5 Lumbar Transforaminal epidural steroid injection with fluoroscopic guidance.  The patient has failed conservative care including home exercise, medications, time and activity modification.  This injection will be diagnostic and hopefully therapeutic.  Please see requesting physician notes for further details and justification.   ROS Otherwise per HPI.  Assessment & Plan: Visit Diagnoses:    ICD-10-CM   1. Lumbar radiculopathy  M54.16 XR C-ARM NO REPORT    Epidural Steroid injection    methylPREDNISolone acetate (DEPO-MEDROL) injection 80 mg      Plan: No additional findings.   Meds & Orders:  Meds ordered this encounter  Medications   methylPREDNISolone acetate (DEPO-MEDROL) injection 80 mg    Orders Placed This Encounter  Procedures   XR C-ARM NO REPORT   Epidural Steroid injection    Follow-up: Return for visit to requesting provider as needed.   Procedures: No procedures performed  Lumbosacral Transforaminal Epidural Steroid Injection - Sub-Pedicular Approach with Fluoroscopic Guidance  [name]      Date of Birth: Jul 15, 1950 MRN: [id number] PCP: Janie Morning, DO      Visit Date: 04/25/2022   Universal Protocol:    Date/Time: 04/25/2022  Consent Given By: the patient  Position: PRONE  Additional Comments: Vital signs were monitored before and after the procedure. Patient was prepped and draped in the usual sterile fashion. The correct patient, procedure, and site was verified.   Injection Procedure Details:   Procedure diagnoses: Lumbar radiculopathy [M54.16]    Meds  Administered:  Meds ordered this encounter  Medications   methylPREDNISolone acetate (DEPO-MEDROL) injection 80 mg    Laterality: Bilateral  Location/Site: L4  Needle:5.0 in., 22 ga.  Short bevel or Quincke spinal needle  Needle Placement: Transforaminal  Findings:    -Comments: Excellent flow of contrast along the nerve, nerve root and into the epidural space.  Procedure Details: After squaring off the end-plates to get a true AP view, the C-arm was positioned so that an oblique view of the foramen as noted above was visualized. The target area is just inferior to the "nose of the scotty dog" or sub pedicular. The soft tissues overlying this structure were infiltrated with 2-3 ml. of 1% Lidocaine without Epinephrine.  The spinal needle was inserted toward the target using a "trajectory" view along the fluoroscope beam.  Under AP and lateral visualization, the needle was advanced so it did not puncture dura and was located close the 6 O'Clock position of the pedical in AP tracterory. Biplanar projections were used to confirm position. Aspiration was confirmed to be negative for CSF and/or blood. A 1-2 ml. volume of Isovue-250 was injected and flow of contrast was noted at each level. Radiographs were obtained for documentation purposes.   After attaining the desired flow of contrast documented above, a 0.5 to 1.0 ml test dose of 0.25% Marcaine was injected into each respective transforaminal space.  The patient was observed for 90 seconds post injection.  After no sensory deficits were reported, and normal lower extremity motor function was noted,   the above injectate was administered so  that equal amounts of the injectate were placed at each foramen (level) into the transforaminal epidural space.   Additional Comments:  No complications occurred Dressing: 2 x 2 sterile gauze and Band-Aid    Post-procedure details: Patient was observed during the procedure. Post-procedure  instructions were reviewed.  Patient left the clinic in stable condition.    Clinical History: EXAM: MRI LUMBAR SPINE WITHOUT CONTRAST   TECHNIQUE: Multiplanar, multisequence MR imaging of the lumbar spine was performed. No intravenous contrast was administered.   COMPARISON:  08/07/2015 report, images not available   FINDINGS: Segmentation:  Standard.   Alignment: Degenerative anterolisthesis at L4-5 measuring 9 mm. Milder L5-S1 anterolisthesis due to the same.   Vertebrae:  No fracture, evidence of discitis, or bone lesion.   Conus medullaris and cauda equina: Conus extends to the L1-2 level. Conus and cauda equina appear normal.   Paraspinal and other soft tissues: Negative for perispinal mass or inflammation.   Disc levels:   L2-L3: Mild foraminal disc bulging   L3-L4: Degenerative facet spurring on both sides. Posterior epidural fat expansion with mild thecal sac stenosis.   L4-L5: Severe facet osteoarthritis with spurring and anterolisthesis. The disc is narrowed and bulging with biforaminal herniation. Advanced spinal and biforaminal stenosis.   L5-S1:Facet degeneration with spurring and anterolisthesis. Disc space narrowing and bulging without neural compression.   IMPRESSION: 1. 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 2. Advanced spinal and biforaminal stenosis at L4-5.     Electronically Signed   By: Jorje Guild M.D.   On: 04/02/2022 04:29     Objective:  VS:  HT:    WT:   BMI:     BP:(!) 162/78  HR:(!) 101bpm  TEMP: ( )  RESP:  Physical Exam Vitals and nursing note reviewed.  Constitutional:      General: She is not in acute distress.    Appearance: Normal appearance. She is not ill-appearing.  HENT:     Head: Normocephalic and atraumatic.     Right Ear: External ear normal.     Left Ear: External ear normal.  Eyes:     Extraocular Movements: Extraocular  movements intact.  Cardiovascular:     Rate and Rhythm: Normal rate.     Pulses: Normal pulses.  Pulmonary:     Effort: Pulmonary effort is normal. No respiratory distress.  Abdominal:     General: There is no distension.     Palpations: Abdomen is soft.  Musculoskeletal:        General: Tenderness present.     Cervical back: Neck supple.     Right lower leg: No edema.     Left lower leg: No edema.     Comments: Patient has good distal strength with no pain over the greater trochanters.  No clonus or focal weakness.  Skin:    Findings: No erythema, lesion or rash.  Neurological:     General: No focal deficit present.     Mental Status: She is alert and oriented to person, place, and time.     Sensory: No sensory deficit.     Motor: No weakness or abnormal muscle tone.     Coordination: Coordination normal.  Psychiatric:        Mood and Affect: Mood normal.        Behavior: Behavior normal.      Imaging: No results found.

## 2022-05-01 NOTE — Procedures (Signed)
Lumbosacral Transforaminal Epidural Steroid Injection - Sub-Pedicular Approach with Fluoroscopic Guidance  [name]      Date of Birth: September 04, 1950 MRN: [id number] PCP: Janie Morning, DO      Visit Date: 04/25/2022   Universal Protocol:    Date/Time: 04/25/2022  Consent Given By: the patient  Position: PRONE  Additional Comments: Vital signs were monitored before and after the procedure. Patient was prepped and draped in the usual sterile fashion. The correct patient, procedure, and site was verified.   Injection Procedure Details:   Procedure diagnoses: Lumbar radiculopathy [M54.16]    Meds Administered:  Meds ordered this encounter  Medications   methylPREDNISolone acetate (DEPO-MEDROL) injection 80 mg    Laterality: Bilateral  Location/Site: L4  Needle:5.0 in., 22 ga.  Short bevel or Quincke spinal needle  Needle Placement: Transforaminal  Findings:    -Comments: Excellent flow of contrast along the nerve, nerve root and into the epidural space.  Procedure Details: After squaring off the end-plates to get a true AP view, the C-arm was positioned so that an oblique view of the foramen as noted above was visualized. The target area is just inferior to the "nose of the scotty dog" or sub pedicular. The soft tissues overlying this structure were infiltrated with 2-3 ml. of 1% Lidocaine without Epinephrine.  The spinal needle was inserted toward the target using a "trajectory" view along the fluoroscope beam.  Under AP and lateral visualization, the needle was advanced so it did not puncture dura and was located close the 6 O'Clock position of the pedical in AP tracterory. Biplanar projections were used to confirm position. Aspiration was confirmed to be negative for CSF and/or blood. A 1-2 ml. volume of Isovue-250 was injected and flow of contrast was noted at each level. Radiographs were obtained for documentation purposes.   After attaining the desired flow  of contrast documented above, a 0.5 to 1.0 ml test dose of 0.25% Marcaine was injected into each respective transforaminal space.  The patient was observed for 90 seconds post injection.  After no sensory deficits were reported, and normal lower extremity motor function was noted,   the above injectate was administered so that equal amounts of the injectate were placed at each foramen (level) into the transforaminal epidural space.   Additional Comments:  No complications occurred Dressing: 2 x 2 sterile gauze and Band-Aid    Post-procedure details: Patient was observed during the procedure. Post-procedure instructions were reviewed.  Patient left the clinic in stable condition.

## 2022-05-06 ENCOUNTER — Ambulatory Visit: Payer: 59 | Admitting: Orthopedic Surgery

## 2022-05-29 ENCOUNTER — Ambulatory Visit (INDEPENDENT_AMBULATORY_CARE_PROVIDER_SITE_OTHER): Payer: 59 | Admitting: Orthopedic Surgery

## 2022-05-29 DIAGNOSIS — M48062 Spinal stenosis, lumbar region with neurogenic claudication: Secondary | ICD-10-CM | POA: Diagnosis not present

## 2022-05-29 NOTE — Progress Notes (Signed)
Orthopedic Spine Surgery Office Note  Assessment: Patient is a 72 y.o. female with bilateral leg pain.  Pain is worse with standing or activity and improves with sitting.  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 Discussed that the conservative treatments include:  -activity modification  -physical therapy  -over the counter pain medications  -medrol dosepak  -lumbar steroid injections -Patient has tried injections, Tylenol, NSAIDs, physical therapy, activity modification   -Patient got significant relief with the injection and was interested in a repeat injection to help with pain relief.  A referral was provided to her today -She is considering surgery but wants to have more help at home postoperatively.  She said she will have this help in the summer.  She is tentatively thinking about surgery for mid June. Talked briefly about L3/4 and L4/5 laminectomies -To get a copy of or get a new A1c prior to any surgical intervention -Patient should return to office in 6 weeks, x-rays at next visit: None   Patient expressed understanding of the plan and all questions were answered to the patient's satisfaction.   ___________________________________________________________________________   History:  Patient is a 72 y.o. female who presents today for lumbar spine.  Patient was previously seen in the office for pain that radiated into her bilateral thighs, bilateral knees, and right buttock.  Since the last time, she got a steroid injection. She got about 80% relief with the injection for 4 weeks but then pain returned.  Pain is still felt in her bilateral thighs, bilateral knees, and right buttock.  It is felt with upright posture or with walking.  It gets better as soon as she sits down.  Pain is rated as a 8 out  of 10 now.  It is worse than before her prior injection.  No bowel or bladder incontinence.  No saddle anesthesia.   Treatments tried: injections, Tylenol, NSAIDs, physical therapy, activity modification   Physical Exam:  General: no acute distress, appears stated age Neurologic: alert, answering questions appropriately, following commands Respiratory: unlabored breathing on room air, symmetric chest rise Psychiatric: appropriate affect, normal cadence to speech   MSK (spine):   -Strength exam                                                   Left                  Right EHL                              5/5                  5/5 TA                                 5/5                  5/5 GSC                             5/5  5/5 Knee extension            5/5                  5/5 Hip flexion                    5/5                  5/5   -Sensory exam                           Sensation intact to light touch in L3-S1 nerve distributions of bilateral lower extremities   -Achilles DTR: 1/4 on the left, 1/4 on the right -Patellar tendon DTR: 1/4 on the left, 1/4 on the right   -Straight leg raise: Negative bilaterally -Femoral nerve stretch test: Negative bilaterally -Clonus: no beats bilaterally   -Left hip exam: No pain through range of motion, negative Stinchfield, negative FABER -Right hip exam: No pain through range of motion, negative Stinchfield, negative FABER  Imaging: XR of the lumbar spine from 04/23/2022 was independently reviewed and interpreted, showing spondylolisthesis at L4/5 - shifts about 67mm between flexion/extension.    MRI of the lumbar spine from 03/31/2022 was independently reviewed and interpreted, showing DDD at L4/5. Spondylolisthesis seen at L4/5.  T2 signal within the left facet joint at L4-5 measures 1.1 mm.  Central and lateral recess stenosis at L4/5.  Lateral recess stenosis at L3/4.   Patient name: Rachel Burns Patient MRN:  [id number] Date of visit: 05/29/22

## 2022-06-03 ENCOUNTER — Other Ambulatory Visit: Payer: Self-pay | Admitting: Orthopedic Surgery

## 2022-06-13 ENCOUNTER — Ambulatory Visit (INDEPENDENT_AMBULATORY_CARE_PROVIDER_SITE_OTHER): Payer: 59 | Admitting: Physical Medicine and Rehabilitation

## 2022-06-13 ENCOUNTER — Other Ambulatory Visit: Payer: Self-pay

## 2022-06-13 VITALS — BP 141/84 | HR 99

## 2022-06-13 DIAGNOSIS — M5416 Radiculopathy, lumbar region: Secondary | ICD-10-CM | POA: Diagnosis not present

## 2022-06-13 MED ORDER — METHYLPREDNISOLONE ACETATE 80 MG/ML IJ SUSP
80.0000 mg | Freq: Once | INTRAMUSCULAR | Status: AC
  Administered 2022-06-13: 80 mg

## 2022-06-13 NOTE — Patient Instructions (Signed)

## 2022-06-13 NOTE — Progress Notes (Signed)
Functional Pain Scale - descriptive words and definitions  Distracting (5)    Aware of pain/able to complete some ADL's but limited by pain/sleep is affected and active distractions are only slightly useful. Moderate range order  Average Pain  varies   +Driver, -BT, -Dye Allergies.  Lower back pain on both sides 

## 2022-06-28 ENCOUNTER — Telehealth: Payer: Self-pay | Admitting: Orthopedic Surgery

## 2022-06-28 MED ORDER — PREGABALIN 50 MG PO CAPS: 100.0000 mg | ORAL_CAPSULE | Freq: Two times a day (BID) | ORAL | 1 refills | Status: DC

## 2022-06-28 NOTE — Progress Notes (Signed)
Seraphim Affinito [facility] - 72 y.o. female MRN [id number]  Date of birth: May 29, 1950  Office Visit Note: Visit Date: 06/13/2022 PCP: Irena Reichmann, DO Referred by: Irena Reichmann, DO  Subjective: Chief Complaint  Patient presents with   Lower Back - Pain   HPI:  Rachel Burns is a 72 y.o. female who comes in today at the request of Dr. Willia Craze for planned Bilateral L4-5 Lumbar Transforaminal epidural steroid injection with fluoroscopic guidance.  The patient has failed conservative care including home exercise, medications, time and activity modification.  This injection will be diagnostic and hopefully therapeutic.  Please see requesting physician notes for further details and justification.   ROS Otherwise per HPI.  Assessment & Plan: Visit Diagnoses:    ICD-10-CM   1. Lumbar radiculopathy  M54.16 XR C-ARM NO REPORT    Epidural Steroid injection    methylPREDNISolone acetate (DEPO-MEDROL) injection 80 mg      Plan: No additional findings.   Meds & Orders:  Meds ordered this encounter  Medications   methylPREDNISolone acetate (DEPO-MEDROL) injection 80 mg    Orders Placed This Encounter  Procedures   XR C-ARM NO REPORT   Epidural Steroid injection    Follow-up: Return for visit to requesting provider as needed.   Procedures: No procedures performed  Lumbosacral Transforaminal Epidural Steroid Injection - Sub-Pedicular Approach with Fluoroscopic Guidance  [name]      Date of Birth: May 31, 1950 MRN: [id number] PCP: Irena Reichmann, DO      Visit Date: 06/13/2022   Universal Protocol:    Date/Time: 06/13/2022  Consent Given By: the patient  Position: PRONE  Additional Comments: Vital signs were monitored before and after the procedure. Patient was prepped and draped in the usual sterile fashion. The correct patient, procedure, and site was verified.   Injection Procedure Details:   Procedure diagnoses: Lumbar radiculopathy [M54.16]    Meds  Administered:  Meds ordered this encounter  Medications   methylPREDNISolone acetate (DEPO-MEDROL) injection 80 mg    Laterality: Bilateral  Location/Site: L4  Needle:5.0 in., 22 ga.  Short bevel or Quincke spinal needle  Needle Placement: Transforaminal  Findings:    -Comments: Excellent flow of contrast along the nerve, nerve root and into the epidural space.  Procedure Details: After squaring off the end-plates to get a true AP view, the C-arm was positioned so that an oblique view of the foramen as noted above was visualized. The target area is just inferior to the "nose of the scotty dog" or sub pedicular. The soft tissues overlying this structure were infiltrated with 2-3 ml. of 1% Lidocaine without Epinephrine.  The spinal needle was inserted toward the target using a "trajectory" view along the fluoroscope beam.  Under AP and lateral visualization, the needle was advanced so it did not puncture dura and was located close the 6 O'Clock position of the pedical in AP tracterory. Biplanar projections were used to confirm position. Aspiration was confirmed to be negative for CSF and/or blood. A 1-2 ml. volume of Isovue-250 was injected and flow of contrast was noted at each level. Radiographs were obtained for documentation purposes.   After attaining the desired flow of contrast documented above, a 0.5 to 1.0 ml test dose of 0.25% Marcaine was injected into each respective transforaminal space.  The patient was observed for 90 seconds post injection.  After no sensory deficits were reported, and normal lower extremity motor function was noted,   the above injectate was administered so  that equal amounts of the injectate were placed at each foramen (level) into the transforaminal epidural space.   Additional Comments:  No complications occurred Dressing: 2 x 2 sterile gauze and Band-Aid    Post-procedure details: Patient was observed during the procedure. Post-procedure  instructions were reviewed.  Patient left the clinic in stable condition.    Clinical History: EXAM: MRI LUMBAR SPINE WITHOUT CONTRAST   TECHNIQUE: Multiplanar, multisequence MR imaging of the lumbar spine was performed. No intravenous contrast was administered.   COMPARISON:  08/07/2015 report, images not available   FINDINGS: Segmentation:  Standard.   Alignment: Degenerative anterolisthesis at L4-5 measuring 9 mm. Milder L5-S1 anterolisthesis due to the same.   Vertebrae:  No fracture, evidence of discitis, or bone lesion.   Conus medullaris and cauda equina: Conus extends to the L1-2 level. Conus and cauda equina appear normal.   Paraspinal and other soft tissues: Negative for perispinal mass or inflammation.   Disc levels:   L2-L3: Mild foraminal disc bulging   L3-L4: Degenerative facet spurring on both sides. Posterior epidural fat expansion with mild thecal sac stenosis.   L4-L5: Severe facet osteoarthritis with spurring and anterolisthesis. The disc is narrowed and bulging with biforaminal herniation. Advanced spinal and biforaminal stenosis.   L5-S1:Facet degeneration with spurring and anterolisthesis. Disc space narrowing and bulging without neural compression.   IMPRESSION: 1. 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 2. Advanced spinal and biforaminal stenosis at L4-5.     Electronically Signed   By: Tiburcio Pea M.D.   On: 04/02/2022 04:29     Objective:  VS:  HT:    WT:   BMI:     BP:(!) 141/84  HR:99bpm  TEMP: ( )  RESP:  Physical Exam Vitals and nursing note reviewed.  Constitutional:      General: She is not in acute distress.    Appearance: Normal appearance. She is not ill-appearing.  HENT:     Head: Normocephalic and atraumatic.     Right Ear: External ear normal.     Left Ear: External ear normal.  Eyes:     Extraocular Movements: Extraocular movements  intact.  Cardiovascular:     Rate and Rhythm: Normal rate.     Pulses: Normal pulses.  Pulmonary:     Effort: Pulmonary effort is normal. No respiratory distress.  Abdominal:     General: There is no distension.     Palpations: Abdomen is soft.  Musculoskeletal:        General: Tenderness present.     Cervical back: Neck supple.     Right lower leg: No edema.     Left lower leg: No edema.     Comments: Patient has good distal strength with no pain over the greater trochanters.  No clonus or focal weakness.  Skin:    Findings: No erythema, lesion or rash.  Neurological:     General: No focal deficit present.     Mental Status: She is alert and oriented to person, place, and time.     Sensory: No sensory deficit.     Motor: No weakness or abnormal muscle tone.     Coordination: Coordination normal.  Psychiatric:        Mood and Affect: Mood normal.        Behavior: Behavior normal.      Imaging: No results found.

## 2022-06-28 NOTE — Procedures (Signed)
Lumbosacral Transforaminal Epidural Steroid Injection - Sub-Pedicular Approach with Fluoroscopic Guidance  [name]      Date of Birth: [birth year] MRN: [id number] PCP: Irena Reichmann, DO      Visit Date: 06/13/2022   Universal Protocol:    Date/Time: 06/13/2022  Consent Given By: the patient  Position: PRONE  Additional Comments: Vital signs were monitored before and after the procedure. Patient was prepped and draped in the usual sterile fashion. The correct patient, procedure, and site was verified.   Injection Procedure Details:   Procedure diagnoses: Lumbar radiculopathy [M54.16]    Meds Administered:  Meds ordered this encounter  Medications   methylPREDNISolone acetate (DEPO-MEDROL) injection 80 mg    Laterality: Bilateral  Location/Site: L4  Needle:5.0 in., 22 ga.  Short bevel or Quincke spinal needle  Needle Placement: Transforaminal  Findings:    -Comments: Excellent flow of contrast along the nerve, nerve root and into the epidural space.  Procedure Details: After squaring off the end-plates to get a true AP view, the C-arm was positioned so that an oblique view of the foramen as noted above was visualized. The target area is just inferior to the "nose of the scotty dog" or sub pedicular. The soft tissues overlying this structure were infiltrated with 2-3 ml. of 1% Lidocaine without Epinephrine.  The spinal needle was inserted toward the target using a "trajectory" view along the fluoroscope beam.  Under AP and lateral visualization, the needle was advanced so it did not puncture dura and was located close the 6 O'Clock position of the pedical in AP tracterory. Biplanar projections were used to confirm position. Aspiration was confirmed to be negative for CSF and/or blood. A 1-2 ml. volume of Isovue-250 was injected and flow of contrast was noted at each level. Radiographs were obtained for documentation purposes.   After attaining the desired flow  of contrast documented above, a 0.5 to 1.0 ml test dose of 0.25% Marcaine was injected into each respective transforaminal space.  The patient was observed for 90 seconds post injection.  After no sensory deficits were reported, and normal lower extremity motor function was noted,   the above injectate was administered so that equal amounts of the injectate were placed at each foramen (level) into the transforaminal epidural space.   Additional Comments:  No complications occurred Dressing: 2 x 2 sterile gauze and Band-Aid    Post-procedure details: Patient was observed during the procedure. Post-procedure instructions were reviewed.  Patient left the clinic in stable condition.

## 2022-06-28 NOTE — Addendum Note (Signed)
Addended by: Willia Craze on: 06/28/2022 02:03 PM   Modules accepted: Orders

## 2022-06-28 NOTE — Telephone Encounter (Signed)
Rachel Burns called triage phone stating that she needs her lyrica refilled. Pharmacy told her it wasn't due until 07/02/22. She says that she had discussed with Dr. Christell Constant for her to take 2 capsules BID and she only has enough for 2 days. She did ask for a call back once refill was sent.

## 2022-07-01 ENCOUNTER — Telehealth: Payer: Self-pay | Admitting: Radiology

## 2022-07-01 MED ORDER — PREGABALIN 100 MG PO CAPS: 100.0000 mg | ORAL_CAPSULE | Freq: Two times a day (BID) | ORAL | 1 refills | Status: DC

## 2022-07-01 NOTE — Telephone Encounter (Signed)
Received prior authorization request from pharmacy ([facility]) on Pregabalin 50mg  capsules.  Prior authorization has to be completed because patient instructed to take #4 capsules a day and there is a limit of #3 capsules daily. I called [employer] per Cover My Meds at [phone] and spoke with [facility] who advised form would need to be completed on [email or web address].  When on website, it is asking me to prior authorize Pregabalin 100mg  capsules at increased package sizes.  Is it possible to change prescription to Pregabalin 100mg  capsule bid vs Pregabalin 50mg  capsule 2 po bid? This would eliminate patient taking more than 3 capsules daily.

## 2022-07-01 NOTE — Addendum Note (Signed)
Addended by: Willia Craze on: 07/01/2022 05:00 PM   Modules accepted: Orders

## 2022-07-02 NOTE — Telephone Encounter (Signed)
I called pharmacy, rx went through with $10 copay. I called patient and advised.

## 2022-07-17 ENCOUNTER — Other Ambulatory Visit (INDEPENDENT_AMBULATORY_CARE_PROVIDER_SITE_OTHER): Payer: 59

## 2022-07-17 ENCOUNTER — Ambulatory Visit (INDEPENDENT_AMBULATORY_CARE_PROVIDER_SITE_OTHER): Payer: 59 | Admitting: Orthopedic Surgery

## 2022-07-17 DIAGNOSIS — M25561 Pain in right knee: Secondary | ICD-10-CM | POA: Diagnosis not present

## 2022-07-17 DIAGNOSIS — M25562 Pain in left knee: Secondary | ICD-10-CM | POA: Diagnosis not present

## 2022-07-17 DIAGNOSIS — G8929 Other chronic pain: Secondary | ICD-10-CM

## 2022-07-17 NOTE — Progress Notes (Signed)
Orthopedic Spine Surgery Office Note  Assessment: Patient is a 72 y.o. female with bilateral leg pain consistent with neurogenic claudication. Has improved with an injection. Also, has bilateral knee pain and degenerative changes on XR   Plan: -Patient has tried injections, Tylenol, NSAIDs, physical therapy, activity modification -She was interested in injections to her knees for the knee pain. This is a reasonable non-operative treatment so this was done today in the office -In regards to her back and neurogenic claudication symptoms, she is doing better since her injection so did not recommend further intervention at this time. If her injection does not provide lasting relief, will discuss surgery as an option for her -Patient wanted to call the office to schedule a follow up visit; x-rays needed at next visit: none   Patient expressed understanding of the plan and all questions were answered to the patient's satisfaction.    Bilateral knee injections: After discussing the risks, benefits, and alternatives of bilateral intraarticular knee steroid injection, patient elected to proceed. Patient was in the seated position with the knees at 90 degrees. The right knee anterolateral soft spot was prepped with an alcohol based prep. Ethyl chloride was used to anesthetize the anterolateral knee soft spot. A 20 gauge needle was then used to inject 1cc of bupivacaine, 1cc depomedrol, and 1cc of lidocaine into the intraarticular space under standard, sterile technique. The needle was withdrawn and a band aid was applied. The same procedure was then repeated on the left knee. The patient tolerated the procedures well.  ___________________________________________________________________________  History: Patient is a 72 y.o. female who has been previously seen in the office for low back pain that radiates into bilateral lower extremities. Since that last visit, she got an injection. She feels that helped with  her back pain and pain radiating into her lower extremities. This pain is more tolerable now. As the pain got better, she noted pain in her bilateral knees. Feels the pain when she is weight bearing or at the end of the day if she was active. Does not have pain at rest. Pain is felt along the medial aspect of the bilateral knees. She has had the knee pain for several months but felt it was part of her back symptoms. Now that her back and radiating leg pain is better, she feels this pain is separate.   Previous treatments: injections, Tylenol, NSAIDs, physical therapy, activity modification  Physical Exam:  General: no acute distress, appears stated age Neurologic: alert, answering questions appropriately, following commands Respiratory: unlabored breathing on room air, symmetric chest rise Psychiatric: appropriate affect, normal cadence to speech   MSK (spine):  -Strength exam      Left  Right EHL    5/5  5/5 TA    5/5  5/5 GSC    5/5  5/5 Knee extension  5/5  5/5 Hip flexion   5/5  5/5  -Sensory exam    Sensation intact to light touch in L3-S1 nerve distributions of bilateral lower extremities  -Achilles DTR: 1/4 on the left, 1/4 on the right -Patellar tendon DTR: 1/4 on the left, 1/4 on the right  -Straight leg raise: negative bilaterally -Femoral nerve stretch test: negative bilaterally -Clonus: no beats bilaterally  -Left knee exam: palpable crepitus through range of motion, range of motion from 0-100, no palpable effusion, negative lachman, negative posterior drawer, knee stable to varus/valgus stress -Right knee exam: palpable crepitus through range of motion, range of motion from 0-100, no palpable effusion, negative lachman,  negative posterior drawer, knee stable to varus/valgus stress  Imaging: XR of the lumbar spine from 04/23/2022 was independently reviewed and interpreted, showing spondylolisthesis at L4/5 - shifts about 2mm between flexion/extension.    MRI of the  lumbar spine from 03/31/2022 was independently reviewed and interpreted, showing DDD at L4/5. Spondylolisthesis seen at L4/5.  T2 signal within the left facet joint at L4-5 measures 1.1 mm.  Central and lateral recess stenosis at L4/5.  Lateral recess stenosis at L3/4.  XR of the right knee from 07/17/2022 was independently reviewed and interpreted, showing joint space loss in the medial compartment. Early osteophyte formation seen in the lateral compartment. Joint space narrowing and osteophyte formation seen in the patellofemoral compartment. No fracture or dislocation seen.   XR of the left knee from 07/17/2022 was independently reviewed and interpreted, showing joint space loss in the medial compartment. Early osteophyte formation seen in the lateral compartment. Joint space narrowing and osteophyte formation seen in the patellofemoral compartment. No fracture or dislocation seen.    Patient name: Rachel Burns Patient MRN: [id number] Date of visit: 07/17/22

## 2022-08-01 ENCOUNTER — Telehealth: Payer: Self-pay | Admitting: Orthopedic Surgery

## 2022-08-01 NOTE — Telephone Encounter (Signed)
I called, she states that the injections her knees did not give her satisfactory results, she states that she is ok to walk short distances but not able to do long distances, she states that Dr. Christell Constant had said something about referring to someone for her knees. She states that she feels like she can tell the difference between the sciatic pain vs the knee pain. Please advise if you want to refer to someone else for her knee or if she should follow up with you.

## 2022-08-01 NOTE — Telephone Encounter (Signed)
Patient wanting to speak to a technician about some issues with her knee.

## 2022-08-02 NOTE — Telephone Encounter (Signed)
Scheduled for 08/09/22  @ 945 am with Roda Shutters

## 2022-08-08 ENCOUNTER — Telehealth: Payer: Self-pay

## 2022-08-08 ENCOUNTER — Ambulatory Visit: Payer: 59 | Admitting: Orthopaedic Surgery

## 2022-08-08 DIAGNOSIS — M1712 Unilateral primary osteoarthritis, left knee: Secondary | ICD-10-CM | POA: Diagnosis not present

## 2022-08-08 DIAGNOSIS — M1711 Unilateral primary osteoarthritis, right knee: Secondary | ICD-10-CM | POA: Diagnosis not present

## 2022-08-08 NOTE — Telephone Encounter (Signed)
Visco supplementation bil knees per Dr. Roda Shutters

## 2022-08-08 NOTE — Telephone Encounter (Signed)
VOB submitted for Durolane, bilateral knee  

## 2022-08-08 NOTE — Progress Notes (Signed)
Office Visit Note   [name]           Date of Birth: 07-Dec-1950           MRN: [id number] Visit Date: 08/08/2022              Requested by: Irena Reichmann, DO [address] [place],  [place] [address] PCP: Irena Reichmann, DO   Assessment & Plan: Visit Diagnoses:  1. Primary osteoarthritis of left knee   2. Primary osteoarthritis of right knee     Plan: Impression is 72 year old female with bilateral knee osteoarthritis with bone-on-bone changes of the medial compartment and mild varus deformity.  Patient understands that she is likely facing surgery but she would like to try a Visco first.  We will get authorization for this.  Follow-up for the injections.  This patient is diagnosed with osteoarthritis of the knee(s).    Radiographs show evidence of joint space narrowing, osteophytes, subchondral sclerosis and/or subchondral cysts.  This patient has knee pain which interferes with functional and activities of daily living.    This patient has experienced inadequate response, adverse effects and/or intolerance with conservative treatments such as acetaminophen, NSAIDS, topical creams, physical therapy or regular exercise, knee bracing and/or weight loss.   This patient has experienced inadequate response or has a contraindication to intra articular steroid injections for at least 3 months.   This patient is not scheduled to have a total knee replacement within 6 months of starting treatment with viscosupplementation.   Follow-Up Instructions: No follow-ups on file.   Orders:  No orders of the defined types were placed in this encounter.  No orders of the defined types were placed in this encounter.     Procedures: No procedures performed   Clinical Data: No additional findings.   Subjective: Chief Complaint  Patient presents with   Other     Bilateral knee pain    HPI Adrain is a 72 year old female referral from Dr. Christell Constant for evaluation  of bilateral knee pain from osteoarthritis.  She sees Dr. Christell Constant for her lumbar spine issues.  She underwent cortisone injections in her knees about 2 to 3 weeks ago.  States her relief was short-lived.  She is interested in trying viscosupplementation.  She also has questions about knee replacement surgery. Review of Systems  Constitutional: Negative.   HENT: Negative.    Eyes: Negative.   Respiratory: Negative.    Cardiovascular: Negative.   Endocrine: Negative.   Musculoskeletal: Negative.   Neurological: Negative.   Hematological: Negative.   Psychiatric/Behavioral: Negative.    All other systems reviewed and are negative.    Objective: Vital Signs: There were no vitals taken for this visit.  Physical Exam Vitals and nursing note reviewed.  Constitutional:      Appearance: She is well-developed.  HENT:     Head: Normocephalic and atraumatic.  Pulmonary:     Effort: Pulmonary effort is normal.  Abdominal:     Palpations: Abdomen is soft.  Musculoskeletal:     Cervical back: Neck supple.  Skin:    General: Skin is warm.     Capillary Refill: Capillary refill takes less than 2 seconds.  Neurological:     Mental Status: She is alert and oriented to person, place, and time.  Psychiatric:        Behavior: Behavior normal.        Thought Content: Thought content normal.        Judgment: Judgment  normal.     Ortho Exam Examination bilateral knees show joint line tenderness.  Patellofemoral crepitus.  Collaterals and cruciates are stable.  Pain with range of motion. Specialty Comments:  EXAM: MRI LUMBAR SPINE WITHOUT CONTRAST   TECHNIQUE: Multiplanar, multisequence MR imaging of the lumbar spine was performed. No intravenous contrast was administered.   COMPARISON:  08/07/2015 report, images not available   FINDINGS: Segmentation:  Standard.   Alignment: Degenerative anterolisthesis at L4-5 measuring 9 mm. Milder L5-S1 anterolisthesis due to the same.    Vertebrae:  No fracture, evidence of discitis, or bone lesion.   Conus medullaris and cauda equina: Conus extends to the L1-2 level. Conus and cauda equina appear normal.   Paraspinal and other soft tissues: Negative for perispinal mass or inflammation.   Disc levels:   L2-L3: Mild foraminal disc bulging   L3-L4: Degenerative facet spurring on both sides. Posterior epidural fat expansion with mild thecal sac stenosis.   L4-L5: Severe facet osteoarthritis with spurring and anterolisthesis. The disc is narrowed and bulging with biforaminal herniation. Advanced spinal and biforaminal stenosis.   L5-S1:Facet degeneration with spurring and anterolisthesis. Disc space narrowing and bulging without neural compression.   IMPRESSION: 1. 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 2. Advanced spinal and biforaminal stenosis at L4-5.     Electronically Signed   By: Tiburcio Pea M.D.   On: 04/02/2022 04:29  Imaging: No results found.   PMFS History: Patient Active Problem List   Diagnosis Date Noted   Spinal stenosis of lumbar region with neurogenic claudication 01/17/2016   Lumbar radiculopathy 01/17/2016   No past medical history on file.  No family history on file.  No past surgical history on file. Social History   Occupational History   Not on file  Tobacco Use   Smoking status: Never   Smokeless tobacco: Never  Substance and Sexual Activity   Alcohol use: Not on file   Drug use: Not on file   Sexual activity: Not on file

## 2022-08-19 ENCOUNTER — Telehealth: Payer: Self-pay | Admitting: Orthopaedic Surgery

## 2022-08-19 NOTE — Telephone Encounter (Signed)
Talked with patient and appointment has been scheduled.  

## 2022-08-19 NOTE — Telephone Encounter (Signed)
Patient called asked if the gel injection was approved? The number to contact patient is [phone]

## 2022-08-27 ENCOUNTER — Ambulatory Visit: Payer: 59 | Admitting: Orthopaedic Surgery

## 2022-08-30 ENCOUNTER — Telehealth: Payer: Self-pay

## 2022-08-30 NOTE — Telephone Encounter (Signed)
She is coming in next week for Bilateral Durolane injections. Just want to make they are approved?

## 2022-08-30 NOTE — Telephone Encounter (Signed)
Nothing yet. I would r/s her for now until April comes back and can look into it

## 2022-09-02 ENCOUNTER — Other Ambulatory Visit: Payer: Self-pay

## 2022-09-02 DIAGNOSIS — M1712 Unilateral primary osteoarthritis, left knee: Secondary | ICD-10-CM

## 2022-09-02 DIAGNOSIS — M1711 Unilateral primary osteoarthritis, right knee: Secondary | ICD-10-CM | POA: Diagnosis not present

## 2022-09-02 NOTE — Progress Notes (Unsigned)
   Office Visit Note   [name]           Date of Birth: 1951-01-25           MRN: [id number] Visit Date: 09/03/2022              Requested by: Irena Reichmann, DO [address] [place],  [place] [address] PCP: Irena Reichmann, DO   Assessment & Plan: Visit Diagnoses:  1. Primary osteoarthritis of left knee   2. Primary osteoarthritis of right knee     Plan: Rachel Burns underwent bilateral Durolane injections.  She tolerated these well.  Follow-up as needed.  Follow-Up Instructions: No follow-ups on file.   Orders:  No orders of the defined types were placed in this encounter.  No orders of the defined types were placed in this encounter.     Procedures: Large Joint Inj: bilateral knee on 09/02/2022 5:37 PM Indications: pain Details: 22 G needle  Arthrogram: No  Medications (Right): 60 mg Sodium Hyaluronate 60 MG/3ML Medications (Left): 60 mg Sodium Hyaluronate 60 MG/3ML Outcome: tolerated well, no immediate complications Patient was prepped and draped in the usual sterile fashion.     Clinical Data: No additional findings.   Subjective: No chief complaint on file.   HPI  Review of Systems   Objective: Vital Signs: There were no vitals taken for this visit.  Physical Exam  Ortho Exam  Specialty Comments:  EXAM: MRI LUMBAR SPINE WITHOUT CONTRAST   TECHNIQUE: Multiplanar, multisequence MR imaging of the lumbar spine was performed. No intravenous contrast was administered.   COMPARISON:  08/07/2015 report, images not available   FINDINGS: Segmentation:  Standard.   Alignment: Degenerative anterolisthesis at L4-5 measuring 9 mm. Milder L5-S1 anterolisthesis due to the same.   Vertebrae:  No fracture, evidence of discitis, or bone lesion.   Conus medullaris and cauda equina: Conus extends to the L1-2 level. Conus and cauda equina appear normal.   Paraspinal and other soft tissues: Negative for perispinal mass  or inflammation.   Disc levels:   L2-L3: Mild foraminal disc bulging   L3-L4: Degenerative facet spurring on both sides. Posterior epidural fat expansion with mild thecal sac stenosis.   L4-L5: Severe facet osteoarthritis with spurring and anterolisthesis. The disc is narrowed and bulging with biforaminal herniation. Advanced spinal and biforaminal stenosis.   L5-S1:Facet degeneration with spurring and anterolisthesis. Disc space narrowing and bulging without neural compression.   IMPRESSION: 1. 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 2. Advanced spinal and biforaminal stenosis at L4-5.     Electronically Signed   By: Tiburcio Pea M.D.   On: 04/02/2022 04:29  Imaging: No results found.   PMFS History: Patient Active Problem List   Diagnosis Date Noted   Spinal stenosis of lumbar region with neurogenic claudication 01/17/2016   Lumbar radiculopathy 01/17/2016   No past medical history on file.  No family history on file.  No past surgical history on file. Social History   Occupational History   Not on file  Tobacco Use   Smoking status: Never   Smokeless tobacco: Never  Substance and Sexual Activity   Alcohol use: Not on file   Drug use: Not on file   Sexual activity: Not on file

## 2022-09-02 NOTE — Telephone Encounter (Signed)
Patient has been approved. I was awaiting her benefits, but no benefits were available on the portal and I am not able to receive them over the phone.  The patient will have to call her insurance plan to see how much she will have to pay for injection.  I can provide her the J code at her appointment.  I will put her information in her chart for her authorization approval.

## 2022-09-03 ENCOUNTER — Ambulatory Visit (INDEPENDENT_AMBULATORY_CARE_PROVIDER_SITE_OTHER): Payer: 59 | Admitting: Orthopaedic Surgery

## 2022-09-03 DIAGNOSIS — M1711 Unilateral primary osteoarthritis, right knee: Secondary | ICD-10-CM

## 2022-09-03 DIAGNOSIS — M1712 Unilateral primary osteoarthritis, left knee: Secondary | ICD-10-CM | POA: Diagnosis not present

## 2022-09-03 MED ORDER — SODIUM HYALURONATE 60 MG/3ML IX PRSY
60.0000 mg | PREFILLED_SYRINGE | INTRA_ARTICULAR | Status: AC | PRN
Start: 2022-09-02 — End: 2022-09-02
  Administered 2022-09-02: 60 mg via INTRA_ARTICULAR

## 2022-09-08 ENCOUNTER — Other Ambulatory Visit: Payer: Self-pay | Admitting: Orthopedic Surgery

## 2022-10-17 ENCOUNTER — Ambulatory Visit (INDEPENDENT_AMBULATORY_CARE_PROVIDER_SITE_OTHER): Payer: 59 | Admitting: Orthopedic Surgery

## 2022-10-17 DIAGNOSIS — M48062 Spinal stenosis, lumbar region with neurogenic claudication: Secondary | ICD-10-CM | POA: Diagnosis not present

## 2022-10-17 NOTE — Progress Notes (Signed)
Orthopedic Spine Surgery Office Note   Assessment: Patient is a 72 y.o. female with bilateral leg pain consistent with neurogenic claudication. Patient has not had lasting relief with conservative treatments     Plan: -Patient has tried injections, Tylenol, NSAIDs, physical therapy, activity modification -Patient has tried conservative treatments without any lasting relief, so discussed operative management as an option for her.  Given that she has no significant T2 signal within the L4/5 facet joints and does not demonstrate any dynamic instability on flexion/extension views, discussed decompression alone as a surgical treatment option. We also discussed decompression alone because she is not having any significant back pain. >90% of her pain is in her legs. After this discussion, patient wanted to proceed with surgery.  -Patient will next be seen at date of surgery     The patient has symptoms consistent with lumbar stenosis and neurogenic claudication. The patient's symptoms were not getting improvement with conservative treatment so operative management was discussed in the form of L4 and L5 segment laminectomies with bilateral foraminotomies. The risks including but not limited to iatrogenic instability, dural tear, nerve root injury, paralysis, persistent pain, infection, bleeding, heart attack, death, fracture, blindness, and need for additional procedures were discussed with the patient. The benefit of the surgery would be improvement in the patient's radiating leg pain. I did discuss adding fusion but since she is not having significant back pain and her spondylolisthesis appears stable, we decided not to proceed with that option. I explained that back pain relief is not the goal of the surgery and it is not reliably alleviated with this surgery. The alternatives to surgical management were covered with the patient and included continued monitoring, physical therapy, over-the-counter pain  medications, ambulatory aids, injections, and activity modification. All the patient's questions were answered to her satisfaction. After this discussion, the patient expressed understanding and elected to proceed with surgical intervention.     ___________________________________________________________________________   History: Patient is a 72 y.o. female who has been previously seen in the office for low back pain that radiates into bilateral lower extremities. Patient has now tried several conservative treatments over the last six months with no lasting relief. She notes pain in her bilateral buttock and posterior thighs. She notes this pain when walking or standing for more than 2-3 minutes. Pain quickly gets better when she sits down. She has no pain if she is sitting.    Previous treatments: injections, Tylenol, NSAIDs, physical therapy, activity modification   Physical Exam:   General: no acute distress, appears stated age Neurologic: alert, answering questions appropriately, following commands Respiratory: unlabored breathing on room air, symmetric chest rise Psychiatric: appropriate affect, normal cadence to speech     MSK (spine):   -Strength exam                                                   Left                  Right EHL                              5/5                  5/5 TA  5/5                  5/5 GSC                             5/5                  5/5 Knee extension            5/5                  5/5 Hip flexion                    5/5                  5/5   -Sensory exam                           Sensation intact to light touch in L3-S1 nerve distributions of bilateral lower extremities   -Achilles DTR: 1/4 on the left, 1/4 on the right -Patellar tendon DTR: 1/4 on the left, 1/4 on the right   -Straight leg raise: negative bilaterally -Femoral nerve stretch test: negative bilaterally -Clonus: no beats bilaterally -Palpable  DP pulses bilaterally, feet warm and well perfused   -Bilateral hip exam: no pain through range of motion, negative stinchfield, negative faber   Imaging: XR of the lumbar spine from 04/23/2022 was previously independently reviewed and interpreted, showing spondylolisthesis at L4/5 - shifts about 1.47mm between flexion/extension.    MRI of the lumbar spine from 03/31/2022 was previously independently reviewed and interpreted, showing DDD at L4/5. Spondylolisthesis seen at L4/5.  T2 signal within the left facet joint at L4-5 measures 1.1 mm.  Central and lateral recess stenosis at L4/5.  Lateral recess stenosis at L3/4.     Patient name: Rachel Burns Patient MRN: [id number] Date of visit: 10/17/22

## 2022-11-15 NOTE — Pre-Procedure Instructions (Signed)
Surgical Instructions   Your procedure is scheduled on Monday, September 23rd. Report to [facility] Main Entrance "A" at 05:30 A.M., then check in with the Admitting office. Any questions or running late day of surgery: call [phone]  Questions prior to your surgery date: call [phone], Monday-Friday, 8am-4pm. If you experience any cold or flu symptoms such as cough, fever, chills, shortness of breath, etc. between now and your scheduled surgery, please notify us at the above number.     Remember:  Do not eat after midnight the night before your surgery  You may drink clear liquids until 04:30 AM the morning of your surgery.   Clear liquids allowed are: Water, Non-Citrus Juices (without pulp), Carbonated Beverages, Clear Tea, Black Coffee Only (NO MILK, CREAM OR POWDERED CREAMER of any kind), and Gatorade.  Patient Instructions  The night before surgery:  No food after midnight. ONLY clear liquids after midnight    The day of surgery (if you have diabetes): Drink ONE (1) 12 oz G2 given to you in your pre admission testing appointment by 04:30 AM the morning of surgery. Drink in one sitting. Do not sip.  This drink was given to you during your hospital  pre-op appointment visit.  Nothing else to drink after completing the  12 oz bottle of G2.         If you have questions, please contact your surgeon's office.    Take these medicines the morning of surgery with A SIP OF WATER  brimonidine (ALPHAGAN P) eye drops omeprazole (PRILOSEC)  pregabalin (LYRICA)     May take these medicines IF NEEDED: azelastine (ASTELIN)    One week prior to surgery, STOP taking any Aspirin (unless otherwise instructed by your surgeon) Aleve, Naproxen, Ibuprofen, celecoxib (CELEBREX), Motrin, Advil, Goody's, BC's, all herbal medications, fish oil, and non-prescription vitamins.   WHAT DO I DO ABOUT MY DIABETES MEDICATION?   HOLD SEMAGLUTIDE (OZEMPIC) FOR 7 DAYS PRIOR TO SURGERY (LAST  DOSE ON OR BEFORE 9/15).    HOW TO MANAGE YOUR DIABETES BEFORE AND AFTER SURGERY  Why is it important to control my blood sugar before and after surgery? Improving blood sugar levels before and after surgery helps healing and can limit problems. A way of improving blood sugar control is eating a healthy diet by:  Eating less sugar and carbohydrates  Increasing activity/exercise  Talking with your doctor about reaching your blood sugar goals High blood sugars (greater than 180 mg/dL) can raise your risk of infections and slow your recovery, so you will need to focus on controlling your diabetes during the weeks before surgery. Make sure that the doctor who takes care of your diabetes knows about your planned surgery including the date and location.  How do I manage my blood sugar before surgery? Check your blood sugar at least 4 times a day, starting 2 days before surgery, to make sure that the level is not too high or low.  Check your blood sugar the morning of your surgery when you wake up and every 2 hours until you get to the Short Stay unit.  If your blood sugar is less than 70 mg/dL, you will need to treat for low blood sugar: Do not take insulin. Treat a low blood sugar (less than 70 mg/dL) with  cup of clear juice (cranberry or apple), 4 glucose tablets, OR glucose gel. Recheck blood sugar in 15 minutes after treatment (to make sure it is greater than 70 mg/dL). If your blood sugar  is not greater than 70 mg/dL on recheck, call [phone] for further instructions. Report your blood sugar to the short stay nurse when you get to Short Stay.  If you are admitted to the hospital after surgery: Your blood sugar will be checked by the staff and you will probably be given insulin after surgery (instead of oral diabetes medicines) to make sure you have good blood sugar levels. The goal for blood sugar control after surgery is 80-180 mg/dL.                    Do NOT Smoke  (Tobacco/Vaping) for 24 hours prior to your procedure.  If you use a CPAP at night, you may bring your mask/headgear for your overnight stay.   You will be asked to remove any contacts, glasses, piercing's, hearing aid's, dentures/partials prior to surgery. Please bring cases for these items if needed.    Patients discharged the day of surgery will not be allowed to drive home, and someone needs to stay with them for 24 hours.  SURGICAL WAITING ROOM VISITATION Patients may have no more than 2 support people in the waiting area - these visitors may rotate.   Pre-op nurse will coordinate an appropriate time for 1 ADULT support person, who may not rotate, to accompany patient in pre-op.  Children under the age of 27 must have an adult with them who is not the patient and must remain in the main waiting area with an adult.  If the patient needs to stay at the hospital during part of their recovery, the visitor guidelines for inpatient rooms apply.  Please refer to the [facility] website for the visitor guidelines for any additional information.   If you received a COVID test during your pre-op visit  it is requested that you wear a mask when out in public, stay away from anyone that may not be feeling well and notify your surgeon if you develop symptoms. If you have been in contact with anyone that has tested positive in the last 10 days please notify you surgeon.      Pre-operative 5 CHG Bathing Instructions   You can play a key role in reducing the risk of infection after surgery. Your skin needs to be as free of germs as possible. You can reduce the number of germs on your skin by washing with CHG (chlorhexidine gluconate) soap before surgery. CHG is an antiseptic soap that kills germs and continues to kill germs even after washing.   DO NOT use if you have an allergy to chlorhexidine/CHG or antibacterial soaps. If your skin becomes reddened or irritated, stop using the CHG and notify one  of our RNs at [phone].   Please shower with the CHG soap starting 4 days before surgery using the following schedule:     Please keep in mind the following:  DO NOT shave, including legs and underarms, starting the day of your first shower.   You may shave your face at any point before/day of surgery.  Place clean sheets on your bed the day you start using CHG soap. Use a clean washcloth (not used since being washed) for each shower. DO NOT sleep with pets once you start using the CHG.   CHG Shower Instructions:  If you choose to wash your hair and private area, wash first with your normal shampoo/soap.  After you use shampoo/soap, rinse your hair and body thoroughly to remove shampoo/soap residue.  Turn the water OFF and  apply about 3 tablespoons (45 ml) of CHG soap to a CLEAN washcloth.  Apply CHG soap ONLY FROM YOUR NECK DOWN TO YOUR TOES (washing for 3-5 minutes)  DO NOT use CHG soap on face, private areas, open wounds, or sores.  Pay special attention to the area where your surgery is being performed.  If you are having back surgery, having someone wash your back for you may be helpful. Wait 2 minutes after CHG soap is applied, then you may rinse off the CHG soap.  Pat dry with a clean towel  Put on clean clothes/pajamas   If you choose to wear lotion, please use ONLY the CHG-compatible lotions on the back of this paper.   Additional instructions for the day of surgery: DO NOT APPLY any lotions, deodorants, cologne, or perfumes.   Do not bring valuables to the hospital. [facility] is not responsible for any belongings/valuables. Do not wear nail polish, gel polish, artificial nails, or any other type of covering on natural nails (fingers and toes) Do not wear jewelry or makeup Put on clean/comfortable clothes.  Please brush your teeth.  Ask your nurse before applying any prescription medications to the skin.     CHG Compatible Lotions   Aveeno Moisturizing lotion   Cetaphil Moisturizing Cream  Cetaphil Moisturizing Lotion  Clairol Herbal Essence Moisturizing Lotion, Dry Skin  Clairol Herbal Essence Moisturizing Lotion, Extra Dry Skin  Clairol Herbal Essence Moisturizing Lotion, Normal Skin  Curel Age Defying Therapeutic Moisturizing Lotion with Alpha Hydroxy  Curel Extreme Care Body Lotion  Curel Soothing Hands Moisturizing Hand Lotion  Curel Therapeutic Moisturizing Cream, Fragrance-Free  Curel Therapeutic Moisturizing Lotion, Fragrance-Free  Curel Therapeutic Moisturizing Lotion, Original Formula  Eucerin Daily Replenishing Lotion  Eucerin Dry Skin Therapy Plus Alpha Hydroxy Crme  Eucerin Dry Skin Therapy Plus Alpha Hydroxy Lotion  Eucerin Original Crme  Eucerin Original Lotion  Eucerin Plus Crme Eucerin Plus Lotion  Eucerin TriLipid Replenishing Lotion  Keri Anti-Bacterial Hand Lotion  Keri Deep Conditioning Original Lotion Dry Skin Formula Softly Scented  Keri Deep Conditioning Original Lotion, Fragrance Free Sensitive Skin Formula  Keri Lotion Fast Absorbing Fragrance Free Sensitive Skin Formula  Keri Lotion Fast Absorbing Softly Scented Dry Skin Formula  Keri Original Lotion  Keri Skin Renewal Lotion Keri Silky Smooth Lotion  Keri Silky Smooth Sensitive Skin Lotion  Nivea Body Creamy Conditioning Oil  Nivea Body Extra Enriched Lotion  Nivea Body Original Lotion  Nivea Body Sheer Moisturizing Lotion Nivea Crme  Nivea Skin Firming Lotion  NutraDerm 30 Skin Lotion  NutraDerm Skin Lotion  NutraDerm Therapeutic Skin Cream  NutraDerm Therapeutic Skin Lotion  ProShield Protective Hand Cream  Provon moisturizing lotion  Please read over the following fact sheets that you were given.

## 2022-11-18 ENCOUNTER — Other Ambulatory Visit: Payer: Self-pay | Admitting: Orthopedic Surgery

## 2022-11-18 ENCOUNTER — Other Ambulatory Visit: Payer: Self-pay

## 2022-11-18 ENCOUNTER — Encounter (HOSPITAL_COMMUNITY)
Admission: RE | Admit: 2022-11-18 | Discharge: 2022-11-18 | Disposition: A | Discharge status: Home / Self Care | Payer: 59 | Source: Ambulatory Visit | Attending: Orthopedic Surgery

## 2022-11-18 ENCOUNTER — Encounter (HOSPITAL_COMMUNITY): Payer: Self-pay

## 2022-11-18 VITALS — BP 133/74 | HR 91 | Temp 97.8°F | Resp 18 | Ht 62.0 in | Wt 183.2 lb

## 2022-11-18 DIAGNOSIS — E119 Type 2 diabetes mellitus without complications: Secondary | ICD-10-CM | POA: Insufficient documentation

## 2022-11-18 DIAGNOSIS — K760 Fatty (change of) liver, not elsewhere classified: Secondary | ICD-10-CM | POA: Insufficient documentation

## 2022-11-18 DIAGNOSIS — Z01818 Encounter for other preprocedural examination: Secondary | ICD-10-CM | POA: Diagnosis present

## 2022-11-18 DIAGNOSIS — M48062 Spinal stenosis, lumbar region with neurogenic claudication: Secondary | ICD-10-CM | POA: Diagnosis not present

## 2022-11-18 HISTORY — DX: Fatty (change of) liver, not elsewhere classified: K76.0

## 2022-11-18 HISTORY — DX: Essential (primary) hypertension: I10

## 2022-11-18 HISTORY — DX: Type 2 diabetes mellitus without complications: E11.9

## 2022-11-18 HISTORY — DX: Other intervertebral disc degeneration, lumbar region: M51.36

## 2022-11-18 HISTORY — DX: Gastro-esophageal reflux disease without esophagitis: K21.9

## 2022-11-18 HISTORY — DX: Other intervertebral disc degeneration, lumbar region without mention of lumbar back pain or lower extremity pain: M51.369

## 2022-11-18 LAB — COMPREHENSIVE METABOLIC PANEL
ALT: 36 U/L (ref 0–44)
AST: 32 U/L (ref 15–41)
Albumin: 3.9 g/dL (ref 3.5–5.0)
Alkaline Phosphatase: 63 U/L (ref 38–126)
Anion gap: 9 (ref 5–15)
BUN: 7 mg/dL — ABNORMAL LOW (ref 8–23)
CO2: 28 mmol/L (ref 22–32)
Calcium: 8.8 mg/dL — ABNORMAL LOW (ref 8.9–10.3)
Chloride: 100 mmol/L (ref 98–111)
Creatinine, Ser: 0.8 mg/dL (ref 0.44–1.00)
GFR, Estimated: >60 mL/min (ref >60)
Glucose, Bld: 123 mg/dL — ABNORMAL HIGH (ref 70–99)
Potassium: 2.9 mmol/L — ABNORMAL LOW (ref 3.5–5.1)
Sodium: 137 mmol/L (ref 135–145)
Total Bilirubin: 0.4 mg/dL (ref 0.3–1.2)
Total Protein: 7.8 g/dL (ref 6.5–8.1)

## 2022-11-18 LAB — CBC
HCT: 44.6 % (ref 36.0–46.0)
Hemoglobin: 14.7 g/dL (ref 12.0–15.0)
MCH: 31.5 pg (ref 26.0–34.0)
MCHC: 33 g/dL (ref 30.0–36.0)
MCV: 95.7 fL (ref 80.0–100.0)
Platelets: 298 10*3/uL (ref 150–400)
RBC: 4.66 MIL/uL (ref 3.87–5.11)
RDW: 12.5 % (ref 11.5–15.5)
WBC: 6.5 10*3/uL (ref 4.0–10.5)
nRBC: 0 % (ref 0.0–0.2)

## 2022-11-18 LAB — HEMOGLOBIN A1C
Hgb A1c MFr Bld: 6.6 % — ABNORMAL HIGH (ref 4.8–5.6)
Mean Plasma Glucose: 142.72 mg/dL

## 2022-11-18 LAB — SURGICAL PCR SCREEN
MRSA, PCR: NEGATIVE
Staphylococcus aureus: NEGATIVE

## 2022-11-18 LAB — GLUCOSE, CAPILLARY: Glucose-Capillary: 124 mg/dL — ABNORMAL HIGH (ref 70–99)

## 2022-11-18 NOTE — Progress Notes (Signed)
PCP - Dr. Irena Reichmann Cardiologist - denies  PPM/ICD - denies   Chest x-ray - denies EKG - 11/18/22 Stress Test - denies ECHO - denies Cardiac Cath - denies  Sleep Study - denies   Fasting Blood Sugar - 100-120 Pt does not check CBG at home   Last dose of GLP1 agonist-  9/10 GLP1 instructions: Hold 7 days. Pt will not take again until after surgery  ASA/Blood Thinner Instructions: n/a   ERAS Protcol - yes PRE-SURGERY G2- given at PAT  COVID TEST- n/a   Anesthesia review: yes, K+ 2.9  Patient denies shortness of breath, fever, cough and chest pain at PAT appointment   All instructions explained to the patient, with a verbal understanding of the material. Patient agrees to go over the instructions while at home for a better understanding.  The opportunity to ask questions was provided.

## 2022-11-24 NOTE — H&P (Signed)
Orthopedic Spine Surgery H&P Note  Assessment: Patient is a 72 y.o. female with spinal stenosis causing neurogenic claudication   Plan: -Out of bed as tolerated, activity as tolerated, no brace -Covered the risks of surgery one more time with the patient and patient elected to proceed with planned surgery -Written consent verified -Hold anticoagulation in anticipation of surgery -Ancef and TXA on all to OR -NPO for procedure -Site marked -To OR when ready  The risks of surgery were covered again this morning. Those risks included but were not limited to iatrogenic instability, dural tear, nerve root injury, paralysis, persistent pain, infection, bleeding, heart attack, death, fracture, and need for additional procedures. The benefit of the surgery would be improvement in the patient's radiating leg pain. I explained that back pain relief is not the goal of the surgery and it is not reliably alleviated with this surgery.   ___________________________________________________________________________  Chief Complaint: low back pain that radiates into bilateral lower extremities  History: Patient is 72 y.o. female who has been previously seen in the office for low back pain that radiates into bilateral lower extremities. Work up was consistent with spinal stenosis causing neurogenic claudication. These symptoms failed to improve with conservative treatment so operative management was discussed at the last office visit. The patient presents today with no changes in her symptoms since the last office visit. See previous office note for further details.    Review of systems: General: denies fevers and chills, myalgias Neurologic: denies recent changes in vision, slurred speech Abdomen: denies nausea, vomiting, hematemesis Respiratory: denies cough, shortness of breath  Past medical history: Hypertension GERD Diabetes Chronic pain   Allergies: Penicillin   Past surgical history:   Cholecystectomy C-section Forearm fracture ORIF   Social history: Denies use of nicotine product (smoking, vaping, patches, smokeless) Alcohol use: 1 drink per week Denies recreational drug use  Family history: -reviewed and not pertinent to spinal stenosis with neurogenic claudication   Physical Exam:  General: no acute distress, appears stated age Neurologic: alert, answering questions appropriately, following commands Cardiovascular: regular rate, no cyanosis Respiratory: unlabored breathing on room air, symmetric chest rise Psychiatric: appropriate affect, normal cadence to speech   MSK (spine):  -Strength exam      Left  Right  EHL    5/5  5/5 TA    5/5  5/5 GSC    5/5  5/5 Knee extension  5/5  5/5 Knee flexion   5/5  5/5 Hip flexion   5/5  5/5  -Sensory exam    Sensation intact to light touch in L3-S1 nerve distributions of bilateral lower extremities   Patient name: Rachel Burns Patient MRN: [id number] Date: 11/25/2022

## 2022-11-24 NOTE — Anesthesia Preprocedure Evaluation (Signed)
Anesthesia Evaluation  Patient identified by MRN, date of birth, ID band Patient awake    Reviewed: Allergy & Precautions, NPO status , Patient's Chart, lab work & pertinent test results  History of Anesthesia Complications Negative for: history of anesthetic complications  Airway Mallampati: II  TM Distance: >3 FB Neck ROM: Full    Dental  (+) Dental Advisory Given   Pulmonary neg pulmonary ROS   breath sounds clear to auscultation       Cardiovascular hypertension, Pt. 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)  BMI 33.5 Ozempic: last dose 2 weeks ago  Renal/GU negative Renal ROS     Musculoskeletal   Abdominal   Peds  Hematology negative hematology ROS (+)   Anesthesia Other Findings   Reproductive/Obstetrics                             Anesthesia Physical Anesthesia Plan  ASA: 3  Anesthesia Plan: General   Post-op Pain Management: Tylenol PO (pre-op)*   Induction: Intravenous  PONV Risk Score and Plan: 3 and Ondansetron, Dexamethasone and Treatment may vary due to age or medical condition  Airway Management Planned: Oral ETT  Additional Equipment: None  Intra-op Plan:   Post-operative Plan: Extubation in OR  Informed Consent: I have reviewed the patients History and Physical, chart, labs and discussed the procedure including the risks, benefits and alternatives for the proposed anesthesia with the patient or authorized representative who has indicated his/her understanding and acceptance.     Dental advisory given  Plan Discussed with: CRNA and Surgeon  Anesthesia Plan Comments:         Anesthesia Quick Evaluation

## 2022-11-25 ENCOUNTER — Ambulatory Visit (HOSPITAL_COMMUNITY): Payer: 59

## 2022-11-25 ENCOUNTER — Other Ambulatory Visit: Payer: Self-pay

## 2022-11-25 ENCOUNTER — Encounter (HOSPITAL_COMMUNITY)
Admission: RE | Disposition: A | Discharge status: Home / Self Care | Payer: Self-pay | Source: Ambulatory Visit | Attending: Orthopedic Surgery

## 2022-11-25 ENCOUNTER — Encounter (HOSPITAL_COMMUNITY): Payer: Self-pay | Admitting: Orthopedic Surgery

## 2022-11-25 ENCOUNTER — Observation Stay (HOSPITAL_COMMUNITY)
Admission: RE | Admit: 2022-11-25 | Discharge: 2022-11-26 | Disposition: A | Discharge status: Home / Self Care | Payer: 59 | Source: Ambulatory Visit | Attending: Orthopedic Surgery | Admitting: Orthopedic Surgery

## 2022-11-25 ENCOUNTER — Ambulatory Visit (HOSPITAL_BASED_OUTPATIENT_CLINIC_OR_DEPARTMENT_OTHER): Payer: 59 | Admitting: Physician Assistant

## 2022-11-25 ENCOUNTER — Ambulatory Visit (HOSPITAL_COMMUNITY): Payer: 59 | Admitting: Physician Assistant

## 2022-11-25 DIAGNOSIS — M48062 Spinal stenosis, lumbar region with neurogenic claudication: Secondary | ICD-10-CM | POA: Diagnosis present

## 2022-11-25 DIAGNOSIS — M4316 Spondylolisthesis, lumbar region: Secondary | ICD-10-CM

## 2022-11-25 DIAGNOSIS — M5416 Radiculopathy, lumbar region: Principal | ICD-10-CM

## 2022-11-25 DIAGNOSIS — E119 Type 2 diabetes mellitus without complications: Secondary | ICD-10-CM | POA: Diagnosis not present

## 2022-11-25 DIAGNOSIS — I10 Essential (primary) hypertension: Secondary | ICD-10-CM

## 2022-11-25 HISTORY — PX: DECOMPRESSIVE LUMBAR LAMINECTOMY LEVEL 1: SHX5791

## 2022-11-25 LAB — POCT I-STAT, CHEM 8
BUN: 15 mg/dL (ref 8–23)
BUN: 21 mg/dL (ref 8–23)
BUN: 22 mg/dL (ref 8–23)
Calcium, Ion: 0.99 mmol/L — ABNORMAL LOW (ref 1.15–1.40)
Calcium, Ion: 1.01 mmol/L — ABNORMAL LOW (ref 1.15–1.40)
Calcium, Ion: 1.13 mmol/L — ABNORMAL LOW (ref 1.15–1.40)
Chloride: 104 mmol/L (ref 98–111)
Chloride: 105 mmol/L (ref 98–111)
Chloride: 105 mmol/L (ref 98–111)
Creatinine, Ser: 0.8 mg/dL (ref 0.44–1.00)
Creatinine, Ser: 0.8 mg/dL (ref 0.44–1.00)
Creatinine, Ser: 0.8 mg/dL (ref 0.44–1.00)
Glucose, Bld: 141 mg/dL — ABNORMAL HIGH (ref 70–99)
Glucose, Bld: 145 mg/dL — ABNORMAL HIGH (ref 70–99)
Glucose, Bld: 147 mg/dL — ABNORMAL HIGH (ref 70–99)
HCT: 43 % (ref 36.0–46.0)
HCT: 44 % (ref 36.0–46.0)
HCT: 45 % (ref 36.0–46.0)
Hemoglobin: 14.6 g/dL (ref 12.0–15.0)
Hemoglobin: 15 g/dL (ref 12.0–15.0)
Hemoglobin: 15.3 g/dL — ABNORMAL HIGH (ref 12.0–15.0)
Potassium: 3.3 mmol/L — ABNORMAL LOW (ref 3.5–5.1)
Potassium: 7 mmol/L (ref 3.5–5.1)
Potassium: 7 mmol/L (ref 3.5–5.1)
Sodium: 136 mmol/L (ref 135–145)
Sodium: 136 mmol/L (ref 135–145)
Sodium: 141 mmol/L (ref 135–145)
TCO2: 23 mmol/L (ref 22–32)
TCO2: 26 mmol/L (ref 22–32)
TCO2: 26 mmol/L (ref 22–32)

## 2022-11-25 LAB — GLUCOSE, CAPILLARY
Glucose-Capillary: 193 mg/dL — ABNORMAL HIGH (ref 70–99)
Glucose-Capillary: 180 mg/dL — ABNORMAL HIGH (ref 70–99)
Glucose-Capillary: 167 mg/dL — ABNORMAL HIGH (ref 70–99)
Glucose-Capillary: 231 mg/dL — ABNORMAL HIGH (ref 70–99)

## 2022-11-25 SURGERY — DECOMPRESSIVE LUMBAR LAMINECTOMY LEVEL 1
Anesthesia: General

## 2022-11-25 MED ORDER — LIDOCAINE 2% (20 MG/ML) 5 ML SYRINGE
INTRAMUSCULAR | Status: DC | PRN
  Administered 2022-11-25: 30 mg via INTRAVENOUS

## 2022-11-25 MED ORDER — SUGAMMADEX SODIUM 200 MG/2ML IV SOLN
INTRAVENOUS | Status: DC | PRN
  Administered 2022-11-25: 200 mg via INTRAVENOUS

## 2022-11-25 MED ORDER — METHOCARBAMOL 500 MG PO TABS
500.0000 mg | ORAL_TABLET | Freq: Four times a day (QID) | ORAL | Status: DC
  Administered 2022-11-25 – 2022-11-26 (×4): 500 mg via ORAL
  Filled 2022-11-25 (×4): qty 1

## 2022-11-25 MED ORDER — PREGABALIN 100 MG PO CAPS
100.0000 mg | ORAL_CAPSULE | Freq: Two times a day (BID) | ORAL | Status: DC
  Administered 2022-11-25 – 2022-11-26 (×3): 100 mg via ORAL
  Filled 2022-11-25 (×3): qty 1

## 2022-11-25 MED ORDER — PROMETHAZINE HCL 25 MG/ML IJ SOLN
6.2500 mg | INTRAMUSCULAR | Status: DC | PRN
  Administered 2022-11-25: 6.25 mg via INTRAVENOUS

## 2022-11-25 MED ORDER — ROCURONIUM BROMIDE 10 MG/ML (PF) SYRINGE
PREFILLED_SYRINGE | INTRAVENOUS | Status: DC | PRN
  Administered 2022-11-25: 60 mg via INTRAVENOUS
  Administered 2022-11-25: 20 mg via INTRAVENOUS

## 2022-11-25 MED ORDER — POLYETHYLENE GLYCOL 3350 17 G PO PACK
17.0000 g | PACK | Freq: Two times a day (BID) | ORAL | Status: DC
  Administered 2022-11-25 – 2022-11-26 (×2): 17 g via ORAL
  Filled 2022-11-25 (×2): qty 1

## 2022-11-25 MED ORDER — ALBUMIN HUMAN 5 % IV SOLN
INTRAVENOUS | Status: DC | PRN
Start: 2022-11-25 — End: 2022-11-25

## 2022-11-25 MED ORDER — TRANEXAMIC ACID-NACL 1000-0.7 MG/100ML-% IV SOLN
1000.0000 mg | INTRAVENOUS | Status: AC
  Administered 2022-11-25: 1000 mg via INTRAVENOUS
  Filled 2022-11-25: qty 100

## 2022-11-25 MED ORDER — 0.9 % SODIUM CHLORIDE (POUR BTL) OPTIME
TOPICAL | Status: DC | PRN
  Administered 2022-11-25: 1000 mL

## 2022-11-25 MED ORDER — LATANOPROSTENE BUNOD 0.024 % OP SOLN: 1.0000 [drp] | Freq: Every day | OPHTHALMIC | Status: DC

## 2022-11-25 MED ORDER — ONDANSETRON HCL 4 MG/2ML IJ SOLN
INTRAMUSCULAR | Status: AC
  Filled 2022-11-25: qty 2

## 2022-11-25 MED ORDER — PHENYLEPHRINE 80 MCG/ML (10ML) SYRINGE FOR IV PUSH (FOR BLOOD PRESSURE SUPPORT)
PREFILLED_SYRINGE | INTRAVENOUS | Status: DC | PRN
Start: 2022-11-25 — End: 2022-11-25
  Administered 2022-11-25: 160 ug via INTRAVENOUS

## 2022-11-25 MED ORDER — THROMBIN 20000 UNITS EX SOLR
CUTANEOUS | Status: DC | PRN
  Administered 2022-11-25: 20 mL via TOPICAL

## 2022-11-25 MED ORDER — POVIDONE-IODINE 10 % EX SWAB
2.0000 | Freq: Once | CUTANEOUS | Status: AC
  Administered 2022-11-25: 2 via TOPICAL

## 2022-11-25 MED ORDER — INSULIN ASPART 100 UNIT/ML IJ SOLN: 0.0000 [IU] | INTRAMUSCULAR | Status: DC | PRN

## 2022-11-25 MED ORDER — INSULIN ASPART 100 UNIT/ML IJ SOLN
0.0000 [IU] | Freq: Three times a day (TID) | INTRAMUSCULAR | Status: DC
  Administered 2022-11-25: 3 [IU] via SUBCUTANEOUS

## 2022-11-25 MED ORDER — HYDROMORPHONE HCL 1 MG/ML IJ SOLN
INTRAMUSCULAR | Status: AC
  Filled 2022-11-25: qty 1

## 2022-11-25 MED ORDER — TOBRAMYCIN 0.3 % OP SOLN
1.0000 [drp] | OPHTHALMIC | Status: DC
  Administered 2022-11-25: 1 [drp] via OPHTHALMIC
  Filled 2022-11-25: qty 5

## 2022-11-25 MED ORDER — ONDANSETRON HCL 4 MG/2ML IJ SOLN
INTRAMUSCULAR | Status: DC | PRN
  Administered 2022-11-25: 4 mg via INTRAVENOUS

## 2022-11-25 MED ORDER — PHENYLEPHRINE HCL-NACL 20-0.9 MG/250ML-% IV SOLN
INTRAVENOUS | Status: DC | PRN
Start: 2022-11-25 — End: 2022-11-25
  Administered 2022-11-25: 25 ug/min via INTRAVENOUS

## 2022-11-25 MED ORDER — PROMETHAZINE HCL 25 MG/ML IJ SOLN
INTRAMUSCULAR | Status: AC
  Filled 2022-11-25: qty 1

## 2022-11-25 MED ORDER — FENTANYL CITRATE (PF) 250 MCG/5ML IJ SOLN
INTRAMUSCULAR | Status: AC
  Filled 2022-11-25: qty 5

## 2022-11-25 MED ORDER — POLYETHYLENE GLYCOL 3350 17 G PO PACK: 17.0000 g | PACK | Freq: Every day | ORAL | 0 refills | Status: AC

## 2022-11-25 MED ORDER — TRIAMTERENE-HCTZ 37.5-25 MG PO TABS
1.0000 | ORAL_TABLET | Freq: Every day | ORAL | Status: DC
  Administered 2022-11-26: 1 via ORAL
  Filled 2022-11-25: qty 1

## 2022-11-25 MED ORDER — SENNA 8.6 MG PO TABS: 1.0000 | ORAL_TABLET | Freq: Two times a day (BID) | ORAL | 0 refills | Status: AC

## 2022-11-25 MED ORDER — CEFAZOLIN SODIUM-DEXTROSE 2-4 GM/100ML-% IV SOLN
2.0000 g | Freq: Four times a day (QID) | INTRAVENOUS | Status: AC
  Administered 2022-11-25 (×2): 2 g via INTRAVENOUS
  Filled 2022-11-25 (×2): qty 100

## 2022-11-25 MED ORDER — DEXAMETHASONE SODIUM PHOSPHATE 10 MG/ML IJ SOLN
INTRAMUSCULAR | Status: AC
  Filled 2022-11-25: qty 1

## 2022-11-25 MED ORDER — LIDOCAINE 2% (20 MG/ML) 5 ML SYRINGE
INTRAMUSCULAR | Status: AC
  Filled 2022-11-25: qty 5

## 2022-11-25 MED ORDER — LACTATED RINGERS IV SOLN: INTRAVENOUS | Status: DC

## 2022-11-25 MED ORDER — OXYCODONE HCL 5 MG/5ML PO SOLN: 5.0000 mg | Freq: Once | ORAL | Status: DC | PRN

## 2022-11-25 MED ORDER — MIDAZOLAM HCL 2 MG/2ML IJ SOLN: 0.5000 mg | Freq: Once | INTRAMUSCULAR | Status: DC | PRN

## 2022-11-25 MED ORDER — DEXAMETHASONE SODIUM PHOSPHATE 10 MG/ML IJ SOLN
10.0000 mg | Freq: Once | INTRAMUSCULAR | Status: AC
  Administered 2022-11-25: 5 mg via INTRAVENOUS

## 2022-11-25 MED ORDER — OXYCODONE HCL 5 MG PO TABS
5.0000 mg | ORAL_TABLET | ORAL | Status: DC | PRN
  Administered 2022-11-25 – 2022-11-26 (×3): 5 mg via ORAL
  Filled 2022-11-25: qty 1
  Filled 2022-11-25: qty 2
  Filled 2022-11-25: qty 1

## 2022-11-25 MED ORDER — HYDROMORPHONE HCL 1 MG/ML IJ SOLN
0.2500 mg | INTRAMUSCULAR | Status: DC | PRN
  Administered 2022-11-25 (×2): 0.5 mg via INTRAVENOUS

## 2022-11-25 MED ORDER — MIDAZOLAM HCL 2 MG/2ML IJ SOLN
INTRAMUSCULAR | Status: AC
  Filled 2022-11-25: qty 2

## 2022-11-25 MED ORDER — CEFAZOLIN SODIUM-DEXTROSE 2-4 GM/100ML-% IV SOLN
2.0000 g | INTRAVENOUS | Status: AC
  Administered 2022-11-25: 2 g via INTRAVENOUS
  Filled 2022-11-25: qty 100

## 2022-11-25 MED ORDER — MIDAZOLAM HCL 2 MG/2ML IJ SOLN
INTRAMUSCULAR | Status: DC | PRN
  Administered 2022-11-25: 2 mg via INTRAVENOUS

## 2022-11-25 MED ORDER — ACETAMINOPHEN 500 MG PO TABS: 1000.0000 mg | ORAL_TABLET | Freq: Three times a day (TID) | ORAL | 0 refills | Status: AC

## 2022-11-25 MED ORDER — TRANEXAMIC ACID-NACL 1000-0.7 MG/100ML-% IV SOLN
1000.0000 mg | Freq: Once | INTRAVENOUS | Status: AC
  Administered 2022-11-25: 1000 mg via INTRAVENOUS
  Filled 2022-11-25: qty 100

## 2022-11-25 MED ORDER — ONDANSETRON HCL 4 MG/2ML IJ SOLN: 4.0000 mg | Freq: Four times a day (QID) | INTRAMUSCULAR | Status: DC | PRN

## 2022-11-25 MED ORDER — ROCURONIUM BROMIDE 10 MG/ML (PF) SYRINGE
PREFILLED_SYRINGE | INTRAVENOUS | Status: AC
  Filled 2022-11-25: qty 10

## 2022-11-25 MED ORDER — BRIMONIDINE TARTRATE 0.15 % OP SOLN
1.0000 [drp] | Freq: Two times a day (BID) | OPHTHALMIC | Status: DC
  Administered 2022-11-25: 1 [drp] via OPHTHALMIC
  Filled 2022-11-25: qty 5

## 2022-11-25 MED ORDER — ACETAMINOPHEN 500 MG PO TABS
1000.0000 mg | ORAL_TABLET | Freq: Three times a day (TID) | ORAL | Status: DC
  Administered 2022-11-25 – 2022-11-26 (×3): 1000 mg via ORAL
  Filled 2022-11-25 (×3): qty 2

## 2022-11-25 MED ORDER — PROPOFOL 10 MG/ML IV BOLUS
INTRAVENOUS | Status: DC | PRN
  Administered 2022-11-25: 120 mg via INTRAVENOUS

## 2022-11-25 MED ORDER — OXYCODONE HCL 5 MG PO TABS: 5.0000 mg | ORAL_TABLET | Freq: Once | ORAL | Status: DC | PRN

## 2022-11-25 MED ORDER — OXYCODONE HCL 5 MG PO TABS: 5.0000 mg | ORAL_TABLET | ORAL | 0 refills | Status: AC | PRN

## 2022-11-25 MED ORDER — THROMBIN 20000 UNITS EX SOLR
CUTANEOUS | Status: AC
  Filled 2022-11-25: qty 20000

## 2022-11-25 MED ORDER — METHOCARBAMOL 500 MG PO TABS: 500.0000 mg | ORAL_TABLET | Freq: Four times a day (QID) | ORAL | 0 refills | Status: DC

## 2022-11-25 MED ORDER — ORAL CARE MOUTH RINSE: 15.0000 mL | Freq: Once | OROMUCOSAL | Status: AC

## 2022-11-25 MED ORDER — PANTOPRAZOLE SODIUM 40 MG PO TBEC
40.0000 mg | DELAYED_RELEASE_TABLET | Freq: Every day | ORAL | Status: DC
  Administered 2022-11-25 – 2022-11-26 (×2): 40 mg via ORAL
  Filled 2022-11-25 (×2): qty 1

## 2022-11-25 MED ORDER — OXYCODONE HCL 5 MG PO TABS
ORAL_TABLET | ORAL | Status: AC
  Filled 2022-11-25: qty 1

## 2022-11-25 MED ORDER — TOBRAMYCIN 0.3 % OP SOLN: 1.0000 [drp] | OPHTHALMIC | 0 refills | Status: DC

## 2022-11-25 MED ORDER — VANCOMYCIN HCL 1000 MG IV SOLR
INTRAVENOUS | Status: DC | PRN
  Administered 2022-11-25: 1000 mg via TOPICAL

## 2022-11-25 MED ORDER — MEPERIDINE HCL 25 MG/ML IJ SOLN: 6.2500 mg | INTRAMUSCULAR | Status: DC | PRN

## 2022-11-25 MED ORDER — PHENYLEPHRINE 80 MCG/ML (10ML) SYRINGE FOR IV PUSH (FOR BLOOD PRESSURE SUPPORT)
PREFILLED_SYRINGE | INTRAVENOUS | Status: AC
  Filled 2022-11-25: qty 10

## 2022-11-25 MED ORDER — SENNA 8.6 MG PO TABS
1.0000 | ORAL_TABLET | Freq: Two times a day (BID) | ORAL | Status: DC
  Administered 2022-11-25 – 2022-11-26 (×2): 8.6 mg via ORAL
  Filled 2022-11-25 (×2): qty 1

## 2022-11-25 MED ORDER — INSULIN ASPART 100 UNIT/ML IJ SOLN
0.0000 [IU] | Freq: Every day | INTRAMUSCULAR | Status: DC
  Administered 2022-11-25: 2 [IU] via SUBCUTANEOUS

## 2022-11-25 MED ORDER — HYDROMORPHONE HCL 1 MG/ML IJ SOLN: 0.5000 mg | INTRAMUSCULAR | Status: DC | PRN

## 2022-11-25 MED ORDER — PROPOFOL 10 MG/ML IV BOLUS
INTRAVENOUS | Status: AC
  Filled 2022-11-25: qty 20

## 2022-11-25 MED ORDER — ONDANSETRON HCL 4 MG PO TABS: 4.0000 mg | ORAL_TABLET | Freq: Four times a day (QID) | ORAL | Status: DC | PRN

## 2022-11-25 MED ORDER — CHLORHEXIDINE GLUCONATE 0.12 % MT SOLN
15.0000 mL | Freq: Once | OROMUCOSAL | Status: AC
  Administered 2022-11-25: 15 mL via OROMUCOSAL
  Filled 2022-11-25: qty 15

## 2022-11-25 MED ORDER — FENTANYL CITRATE (PF) 250 MCG/5ML IJ SOLN
INTRAMUSCULAR | Status: DC | PRN
  Administered 2022-11-25: 150 ug via INTRAVENOUS

## 2022-11-25 MED ORDER — ACETAMINOPHEN 500 MG PO TABS
1000.0000 mg | ORAL_TABLET | Freq: Once | ORAL | Status: AC
  Administered 2022-11-25: 1000 mg via ORAL
  Filled 2022-11-25: qty 2

## 2022-11-25 MED ORDER — INSULIN ASPART 100 UNIT/ML IJ SOLN
INTRAMUSCULAR | Status: AC
  Administered 2022-11-25: 2 [IU] via SUBCUTANEOUS
  Filled 2022-11-25: qty 1

## 2022-11-25 MED ORDER — VANCOMYCIN HCL 1000 MG IV SOLR
INTRAVENOUS | Status: AC
  Filled 2022-11-25: qty 20

## 2022-11-25 SURGICAL SUPPLY — 51 items
APL SKNCLS STERI-STRIP NONHPOA (GAUZE/BANDAGES/DRESSINGS) ×2
BENZOIN TINCTURE PRP APPL 2/3 (GAUZE/BANDAGES/DRESSINGS) ×1 IMPLANT
BUR MATCHSTICK NEURO 3.0 LAGG (BURR) ×1 IMPLANT
CANISTER SUCT 3000ML PPV (MISCELLANEOUS) ×1 IMPLANT
CORD BIPOLAR FORCEPS 12FT (ELECTRODE) IMPLANT
COVER MAYO STAND STRL (DRAPES) ×3 IMPLANT
COVER SURGICAL LIGHT HANDLE (MISCELLANEOUS) ×1 IMPLANT
DRAIN HEMOVAC 7FR (DRAIN) ×1 IMPLANT
DRAPE C-ARM 42X72 X-RAY (DRAPES) ×1 IMPLANT
DRAPE UTILITY XL STRL (DRAPES) ×2 IMPLANT
DRESSING MEPILEX FLEX 4X4 (GAUZE/BANDAGES/DRESSINGS) ×1 IMPLANT
DRSG MEPILEX FLEX 4X4 (GAUZE/BANDAGES/DRESSINGS) ×1
DRSG MEPILEX POST OP 4X8 (GAUZE/BANDAGES/DRESSINGS) ×1 IMPLANT
DRSG MEPITEL 4X7.2 (GAUZE/BANDAGES/DRESSINGS) IMPLANT
DRSG TEGADERM 4X10 (GAUZE/BANDAGES/DRESSINGS) ×1 IMPLANT
DRSG TEGADERM 4X4.75 (GAUZE/BANDAGES/DRESSINGS) ×3 IMPLANT
DURAPREP 26ML APPLICATOR (WOUND CARE) ×1 IMPLANT
ELECT BLADE 4.0 EZ CLEAN MEGAD (MISCELLANEOUS) ×1
ELECT PENCIL ROCKER SW 15FT (MISCELLANEOUS) ×1 IMPLANT
ELECT REM PT RETURN 9FT ADLT (ELECTROSURGICAL) ×1
ELECTRODE BLDE 4.0 EZ CLN MEGD (MISCELLANEOUS) ×1 IMPLANT
ELECTRODE REM PT RTRN 9FT ADLT (ELECTROSURGICAL) ×1 IMPLANT
GAUZE SPONGE 4X4 12PLY STRL (GAUZE/BANDAGES/DRESSINGS) ×1 IMPLANT
GLOVE BIO SURGEON STRL SZ7.5 (GLOVE) ×1 IMPLANT
GLOVE INDICATOR 7.5 STRL GRN (GLOVE) ×1 IMPLANT
GOWN DECONTAM XLG NS DISP (GOWN DISPOSABLE) IMPLANT
GOWN STRL REUS W/ TWL LRG LVL3 (GOWN DISPOSABLE) ×1 IMPLANT
GOWN STRL REUS W/TWL LRG LVL3 (GOWN DISPOSABLE) ×1
GOWN STRL SURGICAL XL XLNG (GOWN DISPOSABLE) ×1 IMPLANT
KIT BASIN OR (CUSTOM PROCEDURE TRAY) ×1 IMPLANT
KIT POSITION SURG JACKSON T1 (MISCELLANEOUS) ×1 IMPLANT
KIT TURNOVER KIT B (KITS) ×1 IMPLANT
NDL 22X1.5 STRL (OR ONLY) (MISCELLANEOUS) ×1 IMPLANT
NEEDLE 22X1.5 STRL (OR ONLY) (MISCELLANEOUS) ×1
NS IRRIG 1000ML POUR BTL (IV SOLUTION) ×1 IMPLANT
PACK LAMINECTOMY ORTHO (CUSTOM PROCEDURE TRAY) ×1 IMPLANT
PATTIES SURGICAL .5 X.5 (GAUZE/BANDAGES/DRESSINGS) IMPLANT
SPONGE SURGIFOAM ABS GEL 100 (HEMOSTASIS) ×1 IMPLANT
SPONGE T-LAP 4X18 ~~LOC~~+RFID (SPONGE) ×1 IMPLANT
SUCTION TUBE FRAZIER 10FR DISP (SUCTIONS) ×1 IMPLANT
SUT BONE WAX W31G (SUTURE) ×1 IMPLANT
SUT MNCRL+ AB 3-0 CT1 36 (SUTURE) ×1 IMPLANT
SUT VIC AB 0 CT1 18XCR BRD8 (SUTURE) ×1 IMPLANT
SUT VIC AB 0 CT1 8-18 (SUTURE) ×1
SUT VIC AB 2-0 CT1 18 (SUTURE) ×1 IMPLANT
SYR BULB IRRIG 60ML STRL (SYRINGE) ×1 IMPLANT
SYR CONTROL 10ML LL (SYRINGE) ×1 IMPLANT
TOWEL GREEN STERILE (TOWEL DISPOSABLE) ×1 IMPLANT
TOWEL GREEN STERILE FF (TOWEL DISPOSABLE) ×1 IMPLANT
TUBING FEATHERFLOW (TUBING) ×1 IMPLANT
WATER STERILE IRR 1000ML POUR (IV SOLUTION) ×1 IMPLANT

## 2022-11-25 NOTE — Progress Notes (Signed)
Orthopedic Surgery Post-operative Progress Note  Assessment: Patient is a 72 y.o. female who is currently admitted after undergoing L4/5 laminectomy   Plan: -Operative plans complete -Drains: none -Out of bed as tolerated, no brace -No bending/lifting/twisting greater than 10 pounds -OT evaluate and treat -Pain control -Diabetic diet -No chemoprophylaxis for dvt or antiplatelets for 72 hours after surgery -Ancef x2 post-operative doses -Disposition: to floor from PACU  ___________________________________________________________________________   Subjective: No acute events since surgery. Recovering in PACU.  Objective:  General: no acute distress Neurologic: drowsy, following commands Respiratory: unlabored breathing on supplemental O2 Skin: dressing clear/dry/intact  MSK (spine):  -Strength exam      Right  Left  EHL    5/5  5/5 TA    5/5  5/5 GSC    5/5  5/5 Knee extension  5/5  5/5 Hip flexion   5/5  5/5  -Sensory exam    Sensation intact to light touch in L3-S1 nerve distributions of bilateral lower extremities   Patient name: Rachel Burns Patient MRN: [id number] Date: 11/25/22

## 2022-11-25 NOTE — Op Note (Signed)
Orthopedic Spine Surgery Operative Report  Procedure: L4, L5 lumbar laminectomy with bilateral foraminotomies  Modifier: none  Date of procedure: 11/25/2022  Patient name: Rachel Burns MRN: [id number] DOB: 1950/07/30  Surgeon: Willia Craze, MD Assistant: None Pre-operative diagnosis: lumbar stenosis with neurogenic claudication Post-operative diagnosis: same as above Findings: L4/5 hypertrophic facets and thickened ligamentum flavum, L4/5 spondylolisthesis  Specimens: none Anesthesia: general EBL: 30cc Complications: none Pre-incision antibiotic: ancef TXA given prior to incision as well  Implants: none   Indication for procedure: Patient is a 72 y.o. female who presented to the office with symptoms consistent with neurogenic claudication. The patient had tried conservative treatments that did not provide any lasting relief. As result, operative management was discussed. L4 and L5 segment laminectomies with bilateral foraminotomies was presented as a treatment option. The risks including but not limited to iatrogenic instability, dural tear, nerve root injury, paralysis, persistent pain, infection, bleeding, heart attack, death, stroke, fracture, blindness, and need for additional procedures were discussed with the patient. The benefit of the surgery would be relief of the patient's radiating leg pain. The alternatives to surgical management were covered with the patient and included continued monitoring, physical therapy, over-the-counter pain medications, ambulatory aids, injections, and activity modification. All the patient's questions were answered to her satisfaction. After this discussion, the patient expressed understanding and elected to proceed with surgical intervention.   Procedure Description: The patient was met in the pre-operative holding area. The patient's identity and consent were verified. The operative site was marked. The patient's remaining questions about the  surgery were answered. The patient was brought back to the operating room. General anesthesia was induced and an endotracheal tube was placed by the anesthesia staff. The patient was transferred to the prone McNary table in the prone position. All bony prominences were well padded. The head of the bed was slightly elevated and the eyes were free from compression by the face pillow. The surgical area was cleansed with alcohol. Fluoroscopy was then brought in to check rotation on the AP image and to mark the levels on the lateral image. The patient's skin was then prepped and draped in a standard, sterile fashion. A time out was performed that identified the patient, the procedure, and the operative levels. All team members agreed with what was stated in the time out.   A midline incision over the spinous processes of the previously marked levels was made and sharp dissection was continued down through the skin and dermis. Electrocautery was then used to continue the midline dissection down to the level of the spinous process. Subperiosteal dissection was performed using electrocautery to expose the lamina out lateral to the facet joint capsule. Care was taken to not violate the facet joint capsules. A lateral fluoroscopic image was taken to confirm the level. Subperiosteal dissection with electrocautery was then done to expose the L4 and L5 lamina and the pars of L4. Again, care was taken to avoid disruption of the facet capsules.    A rongeur was used to remove the spinous processes and interspinous ligaments between the L3/4 interspinous ligament to the cranial portion of the L5 spinous process. Bone wax was used to obtain hemostasis at the bleeding bony surfaces. A high-speed burr was used to thin the lamina at L4 and L5 to the level of the ligamentum flavum. Above the level of the ligamentum, the lamina was thinned with the burr to the approximate level of the ligamentum. Care was taken to leave at least 8mm  of pars interarticularis on each side. A series of Kerrison rongeurs were used to remove the remaining lamina and ligamentum overlying the thecal sac. A woodsen was used to protect the thecal sac as a kerrison was used to remove the medial aspect of the L4/5 facets bilaterally. A woodsen was placed into L4/5 foramen to protect the exiting nerve root as a kerrison was used to remove the soft tissue and bone overlying the nerve root. The same process was repeated for the contralateral side.  A woodsen was placed into the laminectomy site to palpate for any remaining areas of stenosis. There were no further areas of stenosis felt and the woodsen was able to passed freely around the thecal sac. The woodsen was also passed easily into the bilateral L4/5 foramen.   The wound was copiously irrigated with sterile saline. 1g of vancomycin powder was placed into the wound. The fascia was reapproximated with 0 vicryl suture. The subcutaneous fat was reapproximated with 0 vicryl suture. The deep dermal layer was reapproximated with 2-0 viryl. The skin as closed with a 3-0 running moncryl. All counts were correct at the end of the case. The incision was dressed with steri strips and benzoine. An island dressing was placed over the wound. The patient was transferred back to a bed and brought to the post-anesthesia care unit by anesthesia staff in stable condition.  Post-operative plan: The patient will recover in the post-anesthesia care unit and then go to the floor. The patient will receive two post-operative doses of ancef. The patient will be out of bed as tolerated with no brace. The patient will work with physical therapy. The patient will likely discharge to home tomorrow.   Willia Craze, MD Orthopedic Surgeon

## 2022-11-25 NOTE — Anesthesia Postprocedure Evaluation (Signed)
Anesthesia Post Note  [name] [facility]  Procedure(s) Performed: L4-5 LUMBAR LAMINECTOMY WITH BILATERAL FORAMINOTOMIES     Patient location during evaluation: PACU Anesthesia Type: General Level of consciousness: awake and alert, patient cooperative and oriented Pain management: pain level controlled Vital Signs Assessment: post-procedure vital signs reviewed and stable Respiratory status: spontaneous breathing, nonlabored ventilation and respiratory function stable Cardiovascular status: blood pressure returned to baseline and stable Postop Assessment: no apparent nausea or vomiting Anesthetic complications: no   No notable events documented.  Last Vitals:  Vitals:   11/25/22 1245 11/25/22 1312  BP: 134/68 (!) 147/91  Pulse: 78 75  Resp: (!) 8 20  Temp:  (!) 36.2 C  SpO2: 91% 97%    Last Pain:  Vitals:   11/25/22 1312  TempSrc:   PainSc: Asleep                 Dexter Sauser,E. Kayven Aldaco

## 2022-11-25 NOTE — Transfer of Care (Signed)
Immediate Anesthesia Transfer of Care Note  [name]  Procedure(s) Performed: L4-5 LUMBAR LAMINECTOMY WITH BILATERAL FORAMINOTOMIES  Patient Location: PACU  Anesthesia Type:General  Level of Consciousness: awake, alert , oriented, patient cooperative, and responds to stimulation  Airway & Oxygen Therapy: Patient Spontanous Breathing and Patient connected to face mask oxygen  Post-op Assessment: Report given to RN, Post -op Vital signs reviewed and stable, and Patient moving all extremities X 4  Post vital signs: Reviewed and stable  Last Vitals:  Vitals Value Taken Time  BP 107/77 11/25/22 1122  Temp    Pulse 85 11/25/22 1123  Resp 18 11/25/22 1123  SpO2 99 % 11/25/22 1123  Vitals shown include unfiled device data.  Last Pain:  Vitals:   11/25/22 0545  TempSrc: Oral         Complications: No notable events documented.

## 2022-11-25 NOTE — Progress Notes (Signed)
Orthopedic Surgery Post-operative Progress Note  Assessment: Patient is a 72 y.o. female who is currently admitted after undergoing L4/5 laminectomy   Plan: -Operative plans complete -Drains: none -Out of bed as tolerated, no brace -No bending/lifting/twisting greater than 10 pounds -OT evaluate and treat -Pain control -Diabetic diet -No chemoprophylaxis for dvt or antiplatelets for 72 hours after surgery -Ancef x2 post-operative doses -Disposition: remain floor status  ___________________________________________________________________________   Subjective: No acute events since seen in PACU. Is now on the floor. Pain is controlled. She has some back pain when adjusting in bed. Leg pain has improved since surgery. Denies paresthesias and numbness.   Having left eye pain since waking up from surgery. Feels like it got scratched.   Objective:  General: no acute distress, appropriate affect Neurologic: alert, answering questions appropriately, following commands Respiratory: unlabored breathing on nasal canula Skin: dressing clear/dry/intact  MSK (spine):  -Strength exam      Right  Left  EHL    5/5  5/5 TA    5/5  5/5 GSC    5/5  5/5 Knee extension  5/5  5/5 Hip flexion   5/5  5/5  -Sensory exam    Sensation intact to light touch in L3-S1 nerve distributions of bilateral lower extremities   Patient name: Rachel Burns Patient MRN: [id number] Date: 11/25/22

## 2022-11-25 NOTE — Anesthesia Procedure Notes (Signed)
Procedure Name: Intubation Date/Time: 11/25/2022 7:44 AM  Performed by: Shary Decamp, CRNAPre-anesthesia Checklist: Patient identified, Patient being monitored, Timeout performed, Emergency Drugs available and Suction available Patient Re-evaluated:Patient Re-evaluated prior to induction Oxygen Delivery Method: Circle System Utilized Preoxygenation: Pre-oxygenation with 100% oxygen Induction Type: IV induction Ventilation: Mask ventilation without difficulty Laryngoscope Size: Miller and 2 Grade View: Grade I Tube type: Oral Tube size: 7.0 mm Number of attempts: 1 Airway Equipment and Method: Stylet Placement Confirmation: ETT inserted through vocal cords under direct vision, positive ETCO2 and breath sounds checked- equal and bilateral Secured at: 21 cm Tube secured with: Tape Dental Injury: Teeth and Oropharynx as per pre-operative assessment

## 2022-11-25 NOTE — Discharge Instructions (Addendum)
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 You are encouraged to walk as much as desired. You can perform household activities such as cleaning dishes, doing laundry, vacuuming, etc. as long as the ten-pound restriction is followed. You do not need to wear a brace during the post-operative period.   Incision Care: Your incision site has a dressing over it. That dressing should remain in place and dry at all times for a total of one week after surgery. After one week, you can remove the dressing. Underneath the dressing, you will find pieces of tape. You should leave these pieces of tape in place. They will fall off with time. Do not pick, rub, or scrub at them. Do not put cream or lotion over the surgical area. After one week and once the dressing is off, it is okay to let soap and water run over your incision. Again, do not pick, scrub, or rub at the pieces of tape when bathing. Do not submerge (e.g., take a bath, swim, go in a hot tub, etc.) until six weeks after surgery. There may be some bloody drainage from the incision into the dressing after surgery. This is normal. You do not need to replace the dressing. Continue to leave it in place for the one week as instructed above. Should the dressing become saturated with blood or drainage, please call the office for further instructions.   Medications: You have been prescribed oxycodone. This is a narcotic pain medication and should only be taken as prescribed. You should not drink alcohol or operate heavy machinery  (including driving) while taking this medication. The oxycodone can cause constipation as a side effect. For that reason, you have been prescribed senna and miralax. These are both laxatives. You do not need to take this medication if you develop diarrhea. Should you remain constipated even while taking these medications, please increase the dose of miralax to twice daily. Tylenol has been prescribed to be taken every 8 hours, which will give you additional pain relief. Robaxin is a muscle relaxer that has been prescribed to you for muscle spasm type pain. Take this medication as needed.   You can use over-the-counter NSAIDs (ibuprofen, Aleve, Celebrex, naproxen, meloxicam, celecoxib, etc.) for additional pain relief after this surgery starting at 72 hours after surgery. These medications are safe to take with the Tylenol you have been prescribed. You should not take these medications if you have or have had kidney problems or gastrointestinal ulcers. Take these medications as instructed on the packaging.   In order to set expectations for opioid prescriptions, you will only be prescribed opioids for a total of six weeks after surgery and, at two-weeks after surgery, your opioid prescription will start to tapered (decreased dosage and number of pills). If you have ongoing need for opioid medication six weeks after surgery, you will be referred to pain management. If you are already established with a provider that is giving you opioid medications, you should schedule an appointment with them for six weeks after surgery if you feel you are going to need another prescription. State law only allows for opioid prescriptions one week at a time.  If you are running out of opioid medication near the end of the week, please call the office during business hours before running out so I can send you another prescription.   Driving: You should not drive while taking narcotic pain medications. You should start getting back  to driving slowly and you may want to try driving in a parking lot before doing anything more.   Diet: You are safe to resume your regular diet after surgery.   Reasons to Call the Office After Surgery: You should feel free to call the office with any concerns or questions you have in the post-operative period, but you should definitely notify the office if you develop: -shortness of breath, chest pain, or trouble breathing -excessive bleeding, drainage, redness, or swelling around the surgical site -fevers, chills, or pain that is getting worse with each passing day -persistent nausea or vomiting -new weakness in either leg -new or worsening numbness or tingling in either leg -numbness in the groin, bowel or bladder incontinence -other concerns about your surgery  Follow Up Appointments: You should have an office appointment scheduled for approximately two weeks after surgery. If you do not remember when this appointment is or do not already have it scheduled, please call the office to schedule.   Office Information:  -Willia Craze, MD -Phone number: [phone] -Address: [address]       [place], [place] [address]

## 2022-11-25 NOTE — Discharge Summary (Signed)
Orthopedic Surgery Discharge Summary  Patient name: Rachel Burns Patient MRN: [id number] Admit today: 11/25/2022 Discharge date: 11/26/2022  Attending physician: Willia Craze, MD Final diagnosis: lumbar stenosis with neurogenic claudication Findings: L4/5 hypertrophic facets and thickened ligamentum flavum, L4/5 spondylolisthesis   Hospital course: Patient is a 72 y.o. female who was admitted after undergoing L45 laminectomy. The patient had significant pain immediately after surgery, but pain eventually was controlled with a multimodal regimen including oxycodone. Patient had left eye pain after surgery and felt like her eye was scratched so gentamicin drops were started. Labs during the hospitalization revealed ***. The patient worked with physical therapy who recommended discharge to home. The patient was tolerating an oral diet without issue and was voiding spontaneously after surgery. The patient's vitals were stable on the day of discharge. The patient was medically ready for discharge and was discharge to home on post-operative day one.  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 You are encouraged to walk as much as desired. You can perform household activities such as cleaning dishes, doing laundry, vacuuming, etc. as long as the ten-pound restriction is followed. You do not need to wear a brace during the post-operative period.   Incision Care: Your incision site has a dressing over it. That dressing should remain in place and dry  at all times for a total of one week after surgery. After one week, you can remove the dressing. Underneath the dressing, you will find pieces of tape. You should leave these pieces of tape in place. They will fall off with time. Do not pick, rub, or scrub at them. Do not put cream or lotion over the surgical area. After one week and once the dressing is off, it is okay to let soap and water run over your incision. Again, do not pick, scrub, or rub at the pieces of tape when bathing. Do not submerge (e.g., take a bath, swim, go in a hot tub, etc.) until six weeks after surgery. There may be some bloody drainage from the incision into the dressing after surgery. This is normal. You do not need to replace the dressing. Continue to leave it in place for the one week as instructed above. Should the dressing become saturated with blood or drainage, please call the office for further instructions.   Medications: You have been prescribed oxycodone. This is a narcotic pain medication and should only be taken as prescribed. You should not drink alcohol or operate heavy machinery (including driving) while taking this medication. The oxycodone can cause constipation as a side effect. For that reason, you have been prescribed senna and miralax. These are both laxatives. You do not need to take this medication if you develop diarrhea. Should you remain constipated even while taking these medications, please increase the dose of miralax to twice daily. Tylenol has been prescribed to be taken every 8 hours, which will give you additional pain relief. Robaxin is a muscle relaxer that has been prescribed to you for muscle spasm type pain. Take this medication as needed.   For the left eye pain, use tobramycin drops for 24 more hours after  discharge. Do not wear contact lenses for 48 hours.  You can use over-the-counter NSAIDs (ibuprofen, Aleve, Celebrex, naproxen, meloxicam, celecoxib, etc.) for additional pain relief after  this surgery starting at 72 hours after surgery. These medications are safe to take with the Tylenol you have been prescribed. You should not take these medications if you have or have had kidney problems or gastrointestinal ulcers. Take these medications as instructed on the packaging.   In order to set expectations for opioid prescriptions, you will only be prescribed opioids for a total of six weeks after surgery and, at two-weeks after surgery, your opioid prescription will start to tapered (decreased dosage and number of pills). If you have ongoing need for opioid medication six weeks after surgery, you will be referred to pain management. If you are already established with a provider that is giving you opioid medications, you should schedule an appointment with them for six weeks after surgery if you feel you are going to need another prescription. State law only allows for opioid prescriptions one week at a time. If you are running out of opioid medication near the end of the week, please call the office during business hours before running out so I can send you another prescription.   Driving: You should not drive while taking narcotic pain medications. You should start getting back to driving slowly and you may want to try driving in a parking lot before doing anything more.   Diet: You are safe to resume your regular diet after surgery.   Reasons to Call the Office After Surgery: You should feel free to call the office with any concerns or questions you have in the post-operative period, but you should definitely notify the office if you develop: -shortness of breath, chest pain, or trouble breathing -excessive bleeding, drainage, redness, or swelling around the surgical site -fevers, chills, or pain that is getting worse with each passing day -persistent nausea or vomiting -new weakness in either leg -new or worsening numbness or tingling in either leg -numbness in the groin, bowel or  bladder incontinence -other concerns about your surgery  Follow Up Appointments: You should have an office appointment scheduled for approximately two weeks after surgery. If you do not remember when this appointment is or do not already have it scheduled, please call the office to schedule.   Office Information:  -Willia Craze, MD -Phone number: [phone] -Address: [address]       [place], [place] [address]

## 2022-11-26 ENCOUNTER — Encounter (HOSPITAL_COMMUNITY): Payer: Self-pay | Admitting: Orthopedic Surgery

## 2022-11-26 DIAGNOSIS — M48062 Spinal stenosis, lumbar region with neurogenic claudication: Secondary | ICD-10-CM | POA: Diagnosis not present

## 2022-11-26 LAB — CBC
HCT: 38.3 % (ref 36.0–46.0)
Hemoglobin: 12.7 g/dL (ref 12.0–15.0)
MCH: 32.3 pg (ref 26.0–34.0)
MCHC: 33.2 g/dL (ref 30.0–36.0)
MCV: 97.5 fL (ref 80.0–100.0)
Platelets: 272 10*3/uL (ref 150–400)
RBC: 3.93 MIL/uL (ref 3.87–5.11)
RDW: 12.6 % (ref 11.5–15.5)
WBC: 10.2 10*3/uL (ref 4.0–10.5)
nRBC: 0 % (ref 0.0–0.2)

## 2022-11-26 LAB — BASIC METABOLIC PANEL
Anion gap: 11 (ref 5–15)
BUN: 12 mg/dL (ref 8–23)
CO2: 24 mmol/L (ref 22–32)
Calcium: 8.5 mg/dL — ABNORMAL LOW (ref 8.9–10.3)
Chloride: 101 mmol/L (ref 98–111)
Creatinine, Ser: 0.83 mg/dL (ref 0.44–1.00)
GFR, Estimated: >60 mL/min (ref >60)
Glucose, Bld: 146 mg/dL — ABNORMAL HIGH (ref 70–99)
Potassium: 3.7 mmol/L (ref 3.5–5.1)
Sodium: 136 mmol/L (ref 135–145)

## 2022-11-26 LAB — GLUCOSE, CAPILLARY: Glucose-Capillary: 137 mg/dL — ABNORMAL HIGH (ref 70–99)

## 2022-11-26 MED ORDER — INSULIN ASPART 100 UNIT/ML IJ SOLN
0.0000 [IU] | Freq: Three times a day (TID) | INTRAMUSCULAR | Status: DC
  Administered 2022-11-26: 2 [IU] via SUBCUTANEOUS

## 2022-11-26 NOTE — Progress Notes (Signed)
Orthopedic Surgery Post-operative Progress Note  Assessment: Patient is a 72 y.o. female who is currently admitted after undergoing L4/5 laminectomy   Plan: -Operative plans complete -Drains: none -Out of bed as tolerated, no brace -No bending/lifting/twisting greater than 10 pounds -OT evaluate and treat -Pain control -Diabetic diet -No chemoprophylaxis for dvt or antiplatelets for 72 hours after surgery -Ancef x2 post-operative doses -Discharge to home today  ___________________________________________________________________________   Subjective: No acute events overnight. Walked the halls yesterday evening. Was able to get some sleep last night. Back pain controlled with current medications. Leg pain much better than pre-op. Still with mild amount of pain in the right posterior hip. No other leg pain.   Left eye irritation has resolved.  Objective:  General: no acute distress, appropriate affect Neurologic: alert, answering questions appropriately, following commands Respiratory: unlabored breathing on nasal canula Skin: dressing clear/dry/intact  MSK (spine):  -Strength exam      Right  Left  EHL    5/5  5/5 TA    5/5  5/5 GSC    5/5  5/5 Knee extension  5/5  5/5 Hip flexion   5/5  5/5  -Sensory exam    Sensation intact to light touch in L3-S1 nerve distributions of bilateral lower extremities   Patient name: Rachel Burns Patient MRN: [id number] Date: 11/26/22

## 2022-11-26 NOTE — Progress Notes (Signed)
Patient alert and oriented, void, ambulate, surgical site clean and dry no sign infection. D/c instructions explain and given to the patient all questions answered. Pt. D/c home per order.

## 2022-11-26 NOTE — Evaluation (Signed)
Occupational Therapy Evaluation Patient Details Name: Rachel Burns MRN: [id number] DOB: [birth year] Today's Date: 11/26/2022   History of Present Illness Pt is a 72 y/o female presenting on 9/23 for same day L4- L5 lumbar laminectomy. PMH includes: HTN, DM, cholecystectomy.   Clinical Impression   Patient admitted for above and presents with problem list below.  PTA pt was independent and working. Patient was educated on back precautions, ADL compensatory techniques, AE/DME, mobility progression, safety and recommendations.  Today, pt demonstrated ability to complete bed mobility with modified independent, transfers with supervision, functional mobility using RW (for longer distances) and no AD in room with supervision, and ADLs with up to min assist but after education of techniques supervision.  Pt preference to use RW due to unsteadiness and for pain management, recommended PT eval for further assessment of stairs. At discharge, pt will have support from spouse as needed.  Based on performance today, no further OT needs identified and OT will sign off.         If plan is discharge home, recommend the following: A little help with walking and/or transfers;A little help with bathing/dressing/bathroom;Help with stairs or ramp for entrance;Assistance with cooking/housework    Functional Status Assessment  Patient has had a recent decline in their functional status and demonstrates the ability to make significant improvements in function in a reasonable and predictable amount of time.  Equipment Recommendations  Other (comment) (RW)    Recommendations for Other Services PT consult     Precautions / Restrictions Precautions Precautions: Back Precaution Booklet Issued: Yes (comment) Precaution Comments: reviewed with pt Required Braces or Orthoses:  (no brace) Restrictions Weight Bearing Restrictions: No      Mobility Bed Mobility Overal bed mobility: Modified Independent              General bed mobility comments: HOB elevated and using rails, no assist required; plans to use recliner initally at dc    Transfers Overall transfer level: Needs assistance Equipment used: Rolling walker (2 wheels), None Transfers: Sit to/from Stand Sit to Stand: Supervision           General transfer comment: supervision for safety and posture, no physical assist required      Balance Overall balance assessment: Needs assistance Sitting-balance support: No upper extremity supported, Feet supported Sitting balance-Leahy Scale: Good     Standing balance support: Bilateral upper extremity supported, No upper extremity supported, During functional activity Standing balance-Leahy Scale: Fair Standing balance comment: preference to BUE support on RW                           ADL either performed or assessed with clinical judgement   ADL Overall ADL's : Needs assistance/impaired     Grooming: Supervision/safety;Wash/dry hands;Oral care;Standing           Upper Body Dressing : Set up;Sitting;Standing   Lower Body Dressing: Minimal assistance;Sit to/from stand;Cueing for compensatory techniques;Cueing for back precautions Lower Body Dressing Details (indicate cue type and reason): cueing for back precautions and compensatory techniques; needing  assist for L LE for underwear, then cueing to don R LE first with pants and no assist required. Toilet Transfer: Supervision/safety;Ambulation   Toileting- Clothing Manipulation and Hygiene: Supervision/safety;Sit to/from stand   Tub/ Shower Transfer: Tub transfer;Supervision/safety;Ambulation Tub/Shower Transfer Details (indicate cue type and reason): simulated in room Functional mobility during ADLs: Supervision/safety;Rolling walker (2 wheels) General ADL Comments: pt preference to use RW due to mild  instability and back pain     Vision   Vision Assessment?: No apparent visual deficits     Perception          Praxis         Pertinent Vitals/Pain Pain Assessment Pain Assessment: Faces Faces Pain Scale: Hurts little more Pain Location: incisional Pain Descriptors / Indicators: Discomfort, Grimacing, Guarding Pain Intervention(s): Limited activity within patient's tolerance, Monitored during session, Repositioned, RN gave pain meds during session     Extremity/Trunk Assessment Upper Extremity Assessment Upper Extremity Assessment: Overall WFL for tasks assessed   Lower Extremity Assessment Lower Extremity Assessment: Defer to PT evaluation   Cervical / Trunk Assessment Cervical / Trunk Assessment: Back Surgery   Communication Communication Communication: No apparent difficulties   Cognition Arousal: Alert Behavior During Therapy: WFL for tasks assessed/performed Overall Cognitive Status: Within Functional Limits for tasks assessed                                       General Comments       Exercises     Shoulder Instructions      Home Living Family/patient expects to be discharged to:: Private residence Living Arrangements: Spouse/significant other Available Help at Discharge: Family;Available PRN/intermittently Type of Home: House Home Access: Stairs to enter [employer] of Steps: 3 Entrance Stairs-Rails: None Home Layout: Two level;1/2 bath on main level;Bed/bath upstairs Alternate Level Stairs-Number of Steps: flight Alternate Level Stairs-Rails: Right Bathroom Shower/Tub: Tub/shower unit;Walk-in shower   Bathroom Toilet: Standard     Home Equipment: None   Additional Comments: reports she may be able to borrow RW      Prior Functioning/Environment Prior Level of Function : Independent/Modified Independent;Working/employed;Driving               ADLs Comments: attorney        OT Problem List: Pain;Decreased knowledge of precautions;Decreased knowledge of use of DME or AE;Impaired balance (sitting and/or standing)       OT Treatment/Interventions:      OT Goals(Current goals can be found in the care plan section) Acute Rehab OT Goals Patient Stated Goal: home OT Goal Formulation: With patient  OT Frequency:      Co-evaluation              AM-PAC OT "6 Clicks" Daily Activity     Outcome Measure Help from another person eating meals?: None Help from another person taking care of personal grooming?: A Little Help from another person toileting, which includes using toliet, bedpan, or urinal?: A Little Help from another person bathing (including washing, rinsing, drying)?: A Little Help from another person to put on and taking off regular upper body clothing?: A Little Help from another person to put on and taking off regular lower body clothing?: A Little 6 Click Score: 19   End of Session Equipment Utilized During Treatment: Rolling walker (2 wheels) Nurse Communication: Mobility status;Other (comment) (PT order)  Activity Tolerance: Patient tolerated treatment well Patient left: with call bell/phone within reach;Other (comment) (sitting EOB)  OT Visit Diagnosis: Other abnormalities of gait and mobility (R26.89);Pain Pain - part of body:  (back)                Time: 1610-9604 OT Time Calculation (min): 33 min Charges:  OT General Charges $OT Visit: 1 Visit OT Evaluation $OT Eval Low Complexity: 1 Low OT Treatments $Self Care/Home Management :  8-22 mins  Barry Brunner, OT Acute Rehabilitation Services Office [phone]   Chancy Milroy 11/26/2022, 9:34 AM

## 2022-11-26 NOTE — Evaluation (Signed)
Physical Therapy Evaluation  Patient Details Name: Rachel Burns MRN: [id number] DOB: 20-Sep-1950 Today's Date: 11/26/2022  History of Present Illness  Pt is a 72 y/o female presenting on 9/23 for same day L4- L5 lumbar laminectomy. PMH includes: HTN, DM, cholecystectomy.  Clinical Impression  Pt admitted with above diagnosis. At the time of PT eval, pt was able to demonstrate transfers and ambulation with gross modified independence to supervision for safety and RW for support. Pt completed stair training with hands on guarding for safety. Pt was educated on precautions, positioning recommendations, appropriate activity progression, and car transfer. Pt currently with functional limitations due to the deficits listed below (see PT Problem List). Pt will benefit from skilled PT to increase their independence and safety with mobility to allow discharge to the venue listed below.          If plan is discharge home, recommend the following: A little help with walking and/or transfers;A little help with bathing/dressing/bathroom;Assistance with cooking/housework;Assist for transportation;Help with stairs or ramp for entrance   Can travel by private vehicle        Equipment Recommendations Rolling walker (2 wheels) (Youth)  Recommendations for Other Services       Functional Status Assessment Patient has had a recent decline in their functional status and demonstrates the ability to make significant improvements in function in a reasonable and predictable amount of time.     Precautions / Restrictions Precautions Precautions: Back Precaution Booklet Issued: Yes (comment) Precaution Comments: reviewed with pt Required Braces or Orthoses:  (no brace) Restrictions Weight Bearing Restrictions: No      Mobility  Bed Mobility Overal bed mobility: Needs Assistance Bed Mobility: Sidelying to Sit, Rolling, Sit to Sidelying Rolling: Supervision Sidelying to sit: Supervision     Sit to  sidelying: Supervision General bed mobility comments: Increased time and effort but pt able to complete with HOB flat and rails lowered to simulate home environment. No assist required however cues provided throughout for optimal log roll technique.    Transfers Overall transfer level: Needs assistance Equipment used: Rolling walker (2 wheels), None Transfers: Sit to/from Stand Sit to Stand: Supervision           General transfer comment: VC's for hand placement on seated surface for safety. No assist required.    Ambulation/Gait Ambulation/Gait assistance: Supervision Gait Distance (Feet): 200 Feet Assistive device: Rolling walker (2 wheels) Gait Pattern/deviations: Step-through pattern, Decreased stride length, Trunk flexed Gait velocity: Decreased Gait velocity interpretation: 1.31 - 2.62 ft/sec, indicative of limited community ambulator   General Gait Details: VC's for improved posture, closer walker proximity and forward gaze. No assist required and no overt LOB noted.  Stairs Stairs: Yes Stairs assistance: Contact guard assist Stair Management: One rail Right, Step to pattern, Forwards Number of Stairs: 10 General stair comments: VC's for sequencing and general safety. Attempted sideways but pt prefers forward negotiation.  Wheelchair Mobility     Tilt Bed    Modified Rankin (Stroke Patients Only)       Balance Overall balance assessment: Needs assistance Sitting-balance support: No upper extremity supported, Feet supported Sitting balance-Leahy Scale: Good     Standing balance support: Bilateral upper extremity supported, No upper extremity supported, During functional activity Standing balance-Leahy Scale: Fair Standing balance comment: preference to BUE support on RW                             Pertinent Vitals/Pain Pain  Assessment Pain Assessment: Faces Faces Pain Scale: Hurts little more Pain Location: incisional Pain Descriptors /  Indicators: Discomfort, Grimacing, Guarding Pain Intervention(s): Monitored during session, Limited activity within patient's tolerance, Repositioned    Home Living Family/patient expects to be discharged to:: Private residence Living Arrangements: Spouse/significant other Available Help at Discharge: Family;Available PRN/intermittently Type of Home: House Home Access: Stairs to enter Entrance Stairs-Rails: None Entrance Stairs-Number of Steps: 3 Alternate Level Stairs-Number of Steps: flight Home Layout: Two level;1/2 bath on main level;Bed/bath upstairs Home Equipment: None Additional Comments: reports she may be able to borrow RW    Prior Function Prior Level of Function : Independent/Modified Independent;Working/employed;Driving               ADLs Comments: attorney     Extremity/Trunk Assessment   Upper Extremity Assessment Upper Extremity Assessment: Overall WFL for tasks assessed    Lower Extremity Assessment Lower Extremity Assessment: Generalized weakness (Decreased strength and muscular endurance consistent with pre-op diagnosis)    Cervical / Trunk Assessment Cervical / Trunk Assessment: Back Surgery  Communication   Communication Communication: No apparent difficulties  Cognition Arousal: Alert Behavior During Therapy: WFL for tasks assessed/performed Overall Cognitive Status: Within Functional Limits for tasks assessed                                          General Comments      Exercises     Assessment/Plan    PT Assessment Patient needs continued PT services  PT Problem List Decreased strength;Decreased activity tolerance;Decreased balance;Decreased mobility;Decreased knowledge of use of DME;Decreased safety awareness;Decreased knowledge of precautions;Pain       PT Treatment Interventions DME instruction;Gait training;Stair training;Functional mobility training;Therapeutic activities;Therapeutic exercise;Balance  training;Neuromuscular re-education;Patient/family education    PT Goals (Current goals can be found in the Care Plan section)  Acute Rehab PT Goals Patient Stated Goal: Home today PT Goal Formulation: With patient Time For Goal Achievement: 12/03/22 Potential to Achieve Goals: Good    Frequency Min 5X/week     Co-evaluation               AM-PAC PT "6 Clicks" Mobility  Outcome Measure Help needed turning from your back to your side while in a flat bed without using bedrails?: A Little Help needed moving from lying on your back to sitting on the side of a flat bed without using bedrails?: A Little Help needed moving to and from a bed to a chair (including a wheelchair)?: A Little Help needed standing up from a chair using your arms (e.g., wheelchair or bedside chair)?: A Little Help needed to walk in hospital room?: A Little Help needed climbing 3-5 steps with a railing? : A Little 6 Click Score: 18    End of Session Equipment Utilized During Treatment: Gait belt Activity Tolerance: Patient tolerated treatment well Patient left: in bed;with call bell/phone within reach Nurse Communication: Mobility status PT Visit Diagnosis: Unsteadiness on feet (R26.81);Pain Pain - part of body:  (back)    Time: 5784-6962 PT Time Calculation (min) (ACUTE ONLY): 19 min   Charges:   PT Evaluation $PT Eval Low Complexity: 1 Low   PT General Charges $$ ACUTE PT VISIT: 1 Visit         Conni Slipper, PT, DPT Acute Rehabilitation Services Secure Chat Preferred Office: [phone]   Marylynn Pearson 11/26/2022, 10:50 AM

## 2022-11-28 ENCOUNTER — Telehealth: Payer: Self-pay | Admitting: Orthopedic Surgery

## 2022-11-28 MED ORDER — MAGNESIUM CITRATE PO SOLN: 1.0000 | Freq: Once | ORAL | 0 refills | Status: AC

## 2022-11-28 NOTE — Addendum Note (Signed)
Addended by: Willia Craze on: 11/28/2022 01:02 PM   Modules accepted: Orders

## 2022-11-28 NOTE — Telephone Encounter (Signed)
Patient called and said she hasn't use the bathroom since before her surgery 11/25/2022. CB#[phone]

## 2022-11-29 NOTE — Telephone Encounter (Signed)
Lmom advising her of Dr. Tawanna Sat message.

## 2022-12-05 ENCOUNTER — Other Ambulatory Visit: Payer: Self-pay | Admitting: Orthopedic Surgery

## 2022-12-09 ENCOUNTER — Ambulatory Visit (INDEPENDENT_AMBULATORY_CARE_PROVIDER_SITE_OTHER): Payer: 59 | Admitting: Orthopedic Surgery

## 2022-12-09 DIAGNOSIS — Z9889 Other specified postprocedural states: Secondary | ICD-10-CM

## 2022-12-09 NOTE — Progress Notes (Signed)
Orthopedic Surgery Post-operative Office Visit  Procedure: L4/5 laminectomy Date of Surgery: 11/25/2022 (~2 weeks post-op)  Assessment: Patient is a 72 y.o. who has had significant improvement since surgery but still with some pain radiating along the posterior aspect of the buttock and thigh on the right side   Plan: -Operative plans complete -Out of bed as tolerated, no brace -No bending/lifting/twisting greater than 10 pounds -Pain management: Tylenol -Return to office in 4 weeks, lumbar x-rays needed at next visit: AP/lateral/flex/ex lumbar  ___________________________________________________________________________   Subjective: Patient has been at home since discharge from the hospital.  Her back pain has been getting better with time.  She is no longer taking any narcotics.  Her leg pain is significantly better since surgery.  She is no longer having the radiating leg pain when standing or walking.  She initially had no pain after surgery in her legs but then a little over a week after surgery started to develop some tingling type pain in her right buttock and posterior thigh.  She says her right leg pain is overall 80% better.  She feels much better than how she felt before surgery.  She has not noticed any redness or drainage around her incision.  Objective:  General: no acute distress, appropriate affect Neurologic: alert, answering questions appropriately, following commands Respiratory: unlabored breathing on room air Skin: incision is well-approximated with no erythema, induration, active/expressible drainage  MSK (spine):  -Strength exam      Left  Right  EHL    5/5  5/5 TA    5/5  5/5 GSC    5/5  5/5 Knee extension  5/5  5/5 Hip flexion   5/5  5/5  -Sensory exam    Sensation intact to light touch in L3-S1 nerve distributions of bilateral lower extremities  Imaging: None obtained today   Patient name: Rachel Burns Patient MRN: [id number] Date of visit:  12/09/22

## 2022-12-20 ENCOUNTER — Other Ambulatory Visit: Payer: Self-pay | Admitting: Orthopedic Surgery

## 2023-01-03 ENCOUNTER — Ambulatory Visit (INDEPENDENT_AMBULATORY_CARE_PROVIDER_SITE_OTHER): Payer: Managed Care, Other (non HMO) | Admitting: Orthopedic Surgery

## 2023-01-03 DIAGNOSIS — M5416 Radiculopathy, lumbar region: Secondary | ICD-10-CM

## 2023-01-03 NOTE — Progress Notes (Signed)
Orthopedic Surgery Post-operative Office Visit   Procedure: L4/5 laminectomy Date of Surgery: 11/25/2022 (~6 weeks post-op)   Assessment: Patient is a 72 y.o. who has noticed some improvement after surgery but has residual radicular type pain in the right leg in the buttock and lateral thigh. 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 We talked about options for this. She is currently on lyrica so I told her to keep taking that. She wanted to try a diagnostic therapeutic injection. Her foraminal stenosis is at L4/5 so I am going to refer her to Dr. Alvester Morin to see if he can target that nerve root. She asked about further decompression surgery to help. I told her that there is only so much bone that can be taken before potentially destabilizing the spine. I think fusion surgery especially with an interbody device would be the best way to address the residual foraminal stenosis. She really would like to avoid a fusion surgery so we will see how the injection goes. If she gets good and lasting relief with that, then we can continue that treatment going forward. We may also decide to go up on the Lyrica to try to avoid a fusion -Return to office in 6 weeks, lumbar x-rays needed at next visit: AP/lateral/flex/ex   ___________________________________________________________________________     Subjective: Patient continues to notice some improvement since surgery. She can now stand for as long as she would like without pain. She can also bend over to put on her shoes. She still has pain though over the lateral aspect of the thigh to the knee. It radiates to the medial portion of the knee. It does not radiate past the knee. She  notes it is worse with activity. She said she recently cleaned out her fridge and that aggravated it. Also, walking longer distances aggravates it. She has been able to return to walking a half mile loop that she used to do before her symptoms got significant pre-operatively, but has had to take breaks to complete the loop. She is not having any pain in the left lower extremity except at her knee.     Objective:   General: no acute distress, appropriate affect Neurologic: alert, answering questions appropriately, following commands Respiratory: unlabored breathing on room air Skin: incision is well healed with no erythema, induration, active/expressible drainage   MSK (spine):   -Strength exam                                                   Left                  Right   EHL                              5/5                  5/5 TA  5/5                  5/5 GSC                             5/5                  5/5 Knee extension            5/5                  5/5 Hip flexion                    5/5                  5/5   -Sensory exam                           Sensation intact to light touch in L3-S1 nerve distributions of bilateral lower extremities   Imaging: None obtained today     Patient name: Rachel Burns Patient MRN: [id number] Date of visit: 01/03/23]

## 2023-01-15 ENCOUNTER — Ambulatory Visit (INDEPENDENT_AMBULATORY_CARE_PROVIDER_SITE_OTHER): Payer: 59 | Admitting: Physical Medicine and Rehabilitation

## 2023-01-15 ENCOUNTER — Other Ambulatory Visit: Payer: Self-pay

## 2023-01-15 DIAGNOSIS — M5416 Radiculopathy, lumbar region: Secondary | ICD-10-CM | POA: Diagnosis not present

## 2023-01-15 DIAGNOSIS — M961 Postlaminectomy syndrome, not elsewhere classified: Secondary | ICD-10-CM

## 2023-01-15 MED ORDER — METHYLPREDNISOLONE ACETATE 40 MG/ML IJ SUSP
40.0000 mg | Freq: Once | INTRAMUSCULAR | Status: AC
Start: 2023-01-15 — End: 2023-01-15
  Administered 2023-01-15: 40 mg

## 2023-01-15 NOTE — Progress Notes (Signed)
Rachel Burns [facility] - 72 y.o. female MRN [id number]  Date of birth: 24-Nov-1950  Office Visit Note: Visit Date: 01/15/2023 PCP: Irena Reichmann, DO Referred by: London Sheer, MD  Subjective: Chief Complaint  Patient presents with   Lower Back - Pain   HPI:  Rachel Burns is a 72 y.o. female who comes in today at the request of Dr. Willia Craze for planned Right L4-5 Lumbar Transforaminal epidural steroid injection with fluoroscopic guidance.  The patient has failed conservative care including home exercise, medications, time and activity modification.  This injection will be diagnostic and hopefully therapeutic.  Please see requesting physician notes for further details and justification.   ROS Otherwise per HPI.  Assessment & Plan: Visit Diagnoses:    ICD-10-CM   1. Lumbar radiculopathy  M54.16 XR C-ARM NO REPORT    Epidural Steroid injection    methylPREDNISolone acetate (DEPO-MEDROL) injection 40 mg    2. Post laminectomy syndrome  M96.1       Plan: No additional findings.   Meds & Orders:  Meds ordered this encounter  Medications   methylPREDNISolone acetate (DEPO-MEDROL) injection 40 mg    Orders Placed This Encounter  Procedures   XR C-ARM NO REPORT   Epidural Steroid injection    Follow-up: Return for visit to requesting provider as needed.   Procedures: No procedures performed  Lumbosacral Transforaminal Epidural Steroid Injection - Sub-Pedicular Approach with Fluoroscopic Guidance  [name]      Date of Birth: 1950-06-23 MRN: [id number] PCP: Irena Reichmann, DO      Visit Date: 01/15/2023   Universal Protocol:    Date/Time: 01/15/2023  Consent Given By: the patient  Position: PRONE  Additional Comments: Vital signs were monitored before and after the procedure. Patient was prepped and draped in the usual sterile fashion. The correct patient, procedure, and site was verified.   Injection Procedure Details:   Procedure diagnoses: Lumbar  radiculopathy [M54.16]    Meds Administered:  Meds ordered this encounter  Medications   methylPREDNISolone acetate (DEPO-MEDROL) injection 40 mg    Laterality: Right  Location/Site: L4  Needle:5.0 in., 22 ga.  Short bevel or Quincke spinal needle  Needle Placement: Transforaminal  Findings:    -Comments: Excellent flow of contrast along the nerve, nerve root and into the epidural space.  Procedure Details: After squaring off the end-plates to get a true AP view, the C-arm was positioned so that an oblique view of the foramen as noted above was visualized. The target area is just inferior to the "nose of the scotty dog" or sub pedicular. The soft tissues overlying this structure were infiltrated with 2-3 ml. of 1% Lidocaine without Epinephrine.  The spinal needle was inserted toward the target using a "trajectory" view along the fluoroscope beam.  Under AP and lateral visualization, the needle was advanced so it did not puncture dura and was located close the 6 O'Clock position of the pedical in AP tracterory. Biplanar projections were used to confirm position. Aspiration was confirmed to be negative for CSF and/or blood. A 1-2 ml. volume of Isovue-250 was injected and flow of contrast was noted at each level. Radiographs were obtained for documentation purposes.   After attaining the desired flow of contrast documented above, a 0.5 to 1.0 ml test dose of 0.25% Marcaine was injected into each respective transforaminal space.  The patient was observed for 90 seconds post injection.  After no sensory deficits were reported, and normal lower extremity motor  function was noted,   the above injectate was administered so that equal amounts of the injectate were placed at each foramen (level) into the transforaminal epidural space.   Additional Comments:  No complications occurred Dressing: 2 x 2 sterile gauze and Band-Aid    Post-procedure details: Patient was observed during the  procedure. Post-procedure instructions were reviewed.  Patient left the clinic in stable condition.    Clinical History: EXAM: MRI LUMBAR SPINE WITHOUT CONTRAST   TECHNIQUE: Multiplanar, multisequence MR imaging of the lumbar spine was performed. No intravenous contrast was administered.   COMPARISON:  08/07/2015 report, images not available   FINDINGS: Segmentation:  Standard.   Alignment: Degenerative anterolisthesis at L4-5 measuring 9 mm. Milder L5-S1 anterolisthesis due to the same.   Vertebrae:  No fracture, evidence of discitis, or bone lesion.   Conus medullaris and cauda equina: Conus extends to the L1-2 level. Conus and cauda equina appear normal.   Paraspinal and other soft tissues: Negative for perispinal mass or inflammation.   Disc levels:   L2-L3: Mild foraminal disc bulging   L3-L4: Degenerative facet spurring on both sides. Posterior epidural fat expansion with mild thecal sac stenosis.   L4-L5: Severe facet osteoarthritis with spurring and anterolisthesis. The disc is narrowed and bulging with biforaminal herniation. Advanced spinal and biforaminal stenosis.   L5-S1:Facet degeneration with spurring and anterolisthesis. Disc space narrowing and bulging without neural compression.   IMPRESSION: 1. 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 2. Advanced spinal and biforaminal stenosis at L4-5.     Electronically Signed   By: Tiburcio Pea M.D.   On: 04/02/2022 04:29     Objective:  VS:  HT:    WT:   BMI:     BP:   HR: bpm  TEMP: ( )  RESP:  Physical Exam Vitals and nursing note reviewed.  Constitutional:      General: She is not in acute distress.    Appearance: Normal appearance. She is not ill-appearing.  HENT:     Head: Normocephalic and atraumatic.     Right Ear: External ear normal.     Left Ear: External ear normal.  Eyes:     Extraocular Movements:  Extraocular movements intact.  Cardiovascular:     Rate and Rhythm: Normal rate.     Pulses: Normal pulses.  Pulmonary:     Effort: Pulmonary effort is normal. No respiratory distress.  Abdominal:     General: There is no distension.     Palpations: Abdomen is soft.  Musculoskeletal:        General: Tenderness present.     Cervical back: Neck supple.     Right lower leg: No edema.     Left lower leg: No edema.     Comments: Patient has good distal strength with no pain over the greater trochanters.  No clonus or focal weakness.  Skin:    Findings: No erythema, lesion or rash.  Neurological:     General: No focal deficit present.     Mental Status: She is alert and oriented to person, place, and time.     Sensory: No sensory deficit.     Motor: No weakness or abnormal muscle tone.     Coordination: Coordination normal.  Psychiatric:        Mood and Affect: Mood normal.        Behavior: Behavior normal.      Imaging: No results found.

## 2023-01-15 NOTE — Progress Notes (Signed)
Functional Pain Scale - descriptive words and definitions  Moderate (4)   Constantly aware of pain, can complete ADLs with modification/sleep marginally affected at times/passive distraction is of no use, but active distraction gives some relief. Moderate range order  Average Pain 4  R side. Different pain since surgery. She can feel the pain sitting down but it isn't as bad as when she is active moving around.  +Driver, -BT, -Dye Allergies.

## 2023-01-15 NOTE — Procedures (Signed)
Lumbosacral Transforaminal Epidural Steroid Injection - Sub-Pedicular Approach with Fluoroscopic Guidance  [name]      Date of Birth: Feb 15, 1951 MRN: [id number] PCP: Irena Reichmann, DO      Visit Date: 01/15/2023   Universal Protocol:    Date/Time: 01/15/2023  Consent Given By: the patient  Position: PRONE  Additional Comments: Vital signs were monitored before and after the procedure. Patient was prepped and draped in the usual sterile fashion. The correct patient, procedure, and site was verified.   Injection Procedure Details:   Procedure diagnoses: Lumbar radiculopathy [M54.16]    Meds Administered:  Meds ordered this encounter  Medications   methylPREDNISolone acetate (DEPO-MEDROL) injection 40 mg    Laterality: Right  Location/Site: L4  Needle:5.0 in., 22 ga.  Short bevel or Quincke spinal needle  Needle Placement: Transforaminal  Findings:    -Comments: Excellent flow of contrast along the nerve, nerve root and into the epidural space.  Procedure Details: After squaring off the end-plates to get a true AP view, the C-arm was positioned so that an oblique view of the foramen as noted above was visualized. The target area is just inferior to the "nose of the scotty dog" or sub pedicular. The soft tissues overlying this structure were infiltrated with 2-3 ml. of 1% Lidocaine without Epinephrine.  The spinal needle was inserted toward the target using a "trajectory" view along the fluoroscope beam.  Under AP and lateral visualization, the needle was advanced so it did not puncture dura and was located close the 6 O'Clock position of the pedical in AP tracterory. Biplanar projections were used to confirm position. Aspiration was confirmed to be negative for CSF and/or blood. A 1-2 ml. volume of Isovue-250 was injected and flow of contrast was noted at each level. Radiographs were obtained for documentation purposes.   After attaining the desired flow of  contrast documented above, a 0.5 to 1.0 ml test dose of 0.25% Marcaine was injected into each respective transforaminal space.  The patient was observed for 90 seconds post injection.  After no sensory deficits were reported, and normal lower extremity motor function was noted,   the above injectate was administered so that equal amounts of the injectate were placed at each foramen (level) into the transforaminal epidural space.   Additional Comments:  No complications occurred Dressing: 2 x 2 sterile gauze and Band-Aid    Post-procedure details: Patient was observed during the procedure. Post-procedure instructions were reviewed.  Patient left the clinic in stable condition.

## 2023-01-15 NOTE — Patient Instructions (Signed)

## 2023-01-20 ENCOUNTER — Telehealth: Payer: Self-pay | Admitting: Orthopaedic Surgery

## 2023-01-20 NOTE — Telephone Encounter (Signed)
Pt called to submit to insurance for Durolane bil knee injections. Please call pt when approved at [phone].

## 2023-01-20 NOTE — Telephone Encounter (Signed)
Talked with patient and advised her that her next available gel injection would need to be after 03/06/2023. Advised her that I will have to submit after 03/08/2023 due to insurance changes.  Patient voiced that she understands. Will submit in January, 2025.

## 2023-01-25 ENCOUNTER — Other Ambulatory Visit: Payer: Self-pay | Admitting: Orthopedic Surgery

## 2023-02-13 ENCOUNTER — Ambulatory Visit: Payer: 59 | Admitting: Orthopedic Surgery

## 2023-02-13 ENCOUNTER — Other Ambulatory Visit (INDEPENDENT_AMBULATORY_CARE_PROVIDER_SITE_OTHER): Payer: Self-pay

## 2023-02-13 DIAGNOSIS — M5416 Radiculopathy, lumbar region: Secondary | ICD-10-CM | POA: Diagnosis not present

## 2023-02-13 MED ORDER — PREGABALIN 50 MG PO CAPS: 50.0000 mg | ORAL_CAPSULE | Freq: Two times a day (BID) | ORAL | 0 refills | Status: DC

## 2023-02-13 NOTE — Progress Notes (Signed)
Orthopedic Surgery Post-operative Office Visit   Procedure: L4/5 laminectomy Date of Surgery: 11/25/2022 (~2.5 months post-op)   Assessment: Patient is a 72 y.o. who has noticed some improvement after surgery but has residual radicular type pain in the right leg in the buttock and lateral thigh. 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  She was interested in repeating another since it did not give her such good relief.  A referral was provided to her today.  I also provided her with a referral to PT as a treatment to try as well. -If conservative treatments do not provide her with satisfactory relief, discussed L4/5 ALIF and PSIF as a treatment option for her -Return to office in 6 weeks, lumbar x-rays needed at next visit: None   ___________________________________________________________________________     Subjective: Patient notes some improvement since surgery.  She now can stand as long as she would like and can do bending activities without pain.  However, she feels with walking she gets pain radiating down her right leg.  She feels a goes over the lateral aspect of the thigh and then crosses over the knee towards the medial aspect of the proximal leg.  Sometimes the pain radiates down the medial aspect of the leg to the level of the ankle.  When it goes that far, she describes it as a burning pain.  After last visit, she got an injection to the L4 nerve root with Dr. Alvester Morin.  She noted greater than 80% improvement with that injection.  She was feeling good and was able to do more.  However, pain  gradually returned.     Objective:   General: no acute distress, appropriate affect Neurologic: alert, answering questions appropriately, following commands Respiratory: unlabored breathing on room air Skin: incision is well healed   MSK (spine):   -Strength exam                                                   Left                  Right   EHL                              5/5                  5/5 TA                                 5/5                  5/5 GSC                             5/5                  5/5 Knee  extension            5/5                  5/5 Hip flexion                    5/5                  5/5   -Sensory exam                           Sensation intact to light touch in L3-S1 nerve distributions of bilateral lower extremities   Imaging: XRs of the lumbar spine from 02/13/2023 were independently reviewed and interpreted, showing spondylolisthesis at L4/5 that shifts about 1.5 mm between flexion and extension views.  Disc height loss at L4/5.  No other significant degenerative changes seen.  No fracture or dislocation noted.     Patient name: Rachel Burns Patient MRN: [id number] Date of visit: 02/13/23

## 2023-02-28 ENCOUNTER — Other Ambulatory Visit: Payer: Self-pay

## 2023-02-28 ENCOUNTER — Ambulatory Visit (INDEPENDENT_AMBULATORY_CARE_PROVIDER_SITE_OTHER): Payer: 59 | Admitting: Physical Medicine and Rehabilitation

## 2023-02-28 VITALS — BP 125/77 | HR 90

## 2023-02-28 DIAGNOSIS — M961 Postlaminectomy syndrome, not elsewhere classified: Secondary | ICD-10-CM

## 2023-02-28 DIAGNOSIS — M5416 Radiculopathy, lumbar region: Secondary | ICD-10-CM | POA: Diagnosis not present

## 2023-02-28 MED ORDER — METHYLPREDNISOLONE ACETATE 40 MG/ML IJ SUSP
40.0000 mg | Freq: Once | INTRAMUSCULAR | Status: AC
Start: 2023-02-28 — End: 2023-02-28
  Administered 2023-02-28: 40 mg

## 2023-02-28 NOTE — Progress Notes (Signed)
Functional Pain Scale - descriptive words and definitions  Mild (2)   Noticeable when not distracted/no impact on ADL's/sleep only slightly affected and able to   use both passive and active distraction for comfort. Mild range order  Average Pain 5   +Driver, -BT, -Dye Allergies.

## 2023-02-28 NOTE — Progress Notes (Signed)
[facility] - 72 y.o. female MRN [id number]  Date of birth: 11-01-1950  Office Visit Note: Visit Date: 02/28/2023 PCP: Irena Reichmann, DO Referred by: London Sheer, MD  Subjective: Chief Complaint  Patient presents with   Lower Back - Pain   HPI:  Rachel Burns is a 72 y.o. female who comes in today for planned repeat Right L4-5  Lumbar Transforaminal epidural steroid injection with fluoroscopic guidance.  The patient has failed conservative care including home exercise, medications, time and activity modification.  This injection will be diagnostic and hopefully therapeutic.  Please see requesting physician notes for further details and justification. Patient received more than 90% pain relief from prior injection.   Referring: Dr. Willia Craze   ROS Otherwise per HPI.  Assessment & Plan: Visit Diagnoses:    ICD-10-CM   1. Lumbar radiculopathy  M54.16 XR C-ARM NO REPORT    Epidural Steroid injection    methylPREDNISolone acetate (DEPO-MEDROL) injection 40 mg    2. Post laminectomy syndrome  M96.1 XR C-ARM NO REPORT    Epidural Steroid injection    methylPREDNISolone acetate (DEPO-MEDROL) injection 40 mg      Plan: No additional findings.   Meds & Orders:  Meds ordered this encounter  Medications   methylPREDNISolone acetate (DEPO-MEDROL) injection 40 mg    Orders Placed This Encounter  Procedures   XR C-ARM NO REPORT   Epidural Steroid injection    Follow-up: Return for visit to requesting provider as needed.   Procedures: No procedures performed  Lumbosacral Transforaminal Epidural Steroid Injection - Sub-Pedicular Approach with Fluoroscopic Guidance  [name]      Date of Birth: 18-Feb-1951 MRN: [id number] PCP: Irena Reichmann, DO      Visit Date: 02/28/2023   Universal Protocol:    Date/Time: 02/28/2023  Consent Given By: the patient  Position: PRONE  Additional Comments: Vital signs were monitored before and after the  procedure. Patient was prepped and draped in the usual sterile fashion. The correct patient, procedure, and site was verified.   Injection Procedure Details:   Procedure diagnoses: Lumbar radiculopathy [M54.16]    Meds Administered:  Meds ordered this encounter  Medications   methylPREDNISolone acetate (DEPO-MEDROL) injection 40 mg    Laterality: Right  Location/Site: L4  Needle:5.0 in., 22 ga.  Short bevel or Quincke spinal needle  Needle Placement: Transforaminal  Findings:    -Comments: Excellent flow of contrast along the nerve, nerve root and into the epidural space.  Procedure Details: After squaring off the end-plates to get a true AP view, the C-arm was positioned so that an oblique view of the foramen as noted above was visualized. The target area is just inferior to the "nose of the scotty dog" or sub pedicular. The soft tissues overlying this structure were infiltrated with 2-3 ml. of 1% Lidocaine without Epinephrine.  The spinal needle was inserted toward the target using a "trajectory" view along the fluoroscope beam.  Under AP and lateral visualization, the needle was advanced so it did not puncture dura and was located close the 6 O'Clock position of the pedical in AP tracterory. Biplanar projections were used to confirm position. Aspiration was confirmed to be negative for CSF and/or blood. A 1-2 ml. volume of Isovue-250 was injected and flow of contrast was noted at each level. Radiographs were obtained for documentation purposes.   After attaining the desired flow of contrast documented above, a 0.5 to 1.0 ml test dose of 0.25% Marcaine  was injected into each respective transforaminal space.  The patient was observed for 90 seconds post injection.  After no sensory deficits were reported, and normal lower extremity motor function was noted,   the above injectate was administered so that equal amounts of the injectate were placed at each foramen (level) into the  transforaminal epidural space.   Additional Comments:  No complications occurred Dressing: 2 x 2 sterile gauze and Band-Aid    Post-procedure details: Patient was observed during the procedure. Post-procedure instructions were reviewed.  Patient left the clinic in stable condition.    Clinical History: EXAM: MRI LUMBAR SPINE WITHOUT CONTRAST   TECHNIQUE: Multiplanar, multisequence MR imaging of the lumbar spine was performed. No intravenous contrast was administered.   COMPARISON:  08/07/2015 report, images not available   FINDINGS: Segmentation:  Standard.   Alignment: Degenerative anterolisthesis at L4-5 measuring 9 mm. Milder L5-S1 anterolisthesis due to the same.   Vertebrae:  No fracture, evidence of discitis, or bone lesion.   Conus medullaris and cauda equina: Conus extends to the L1-2 level. Conus and cauda equina appear normal.   Paraspinal and other soft tissues: Negative for perispinal mass or inflammation.   Disc levels:   L2-L3: Mild foraminal disc bulging   L3-L4: Degenerative facet spurring on both sides. Posterior epidural fat expansion with mild thecal sac stenosis.   L4-L5: Severe facet osteoarthritis with spurring and anterolisthesis. The disc is narrowed and bulging with biforaminal herniation. Advanced spinal and biforaminal stenosis.   L5-S1:Facet degeneration with spurring and anterolisthesis. Disc space narrowing and bulging without neural compression.   IMPRESSION: 1. 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 2. Advanced spinal and biforaminal stenosis at L4-5.     Electronically Signed   By: Tiburcio Pea M.D.   On: 04/02/2022 04:29     Objective:  VS:  HT:    WT:   BMI:     BP:125/77  HR:90bpm  TEMP: ( )  RESP:  Physical Exam Vitals and nursing note reviewed.  Constitutional:      General: She is not in acute distress.    Appearance: Normal  appearance. She is not ill-appearing.  HENT:     Head: Normocephalic and atraumatic.     Right Ear: External ear normal.     Left Ear: External ear normal.  Eyes:     Extraocular Movements: Extraocular movements intact.  Cardiovascular:     Rate and Rhythm: Normal rate.     Pulses: Normal pulses.  Pulmonary:     Effort: Pulmonary effort is normal. No respiratory distress.  Abdominal:     General: There is no distension.     Palpations: Abdomen is soft.  Musculoskeletal:        General: Tenderness present.     Cervical back: Neck supple.     Right lower leg: No edema.     Left lower leg: No edema.     Comments: Patient has good distal strength with no pain over the greater trochanters.  No clonus or focal weakness.  Skin:    Findings: No erythema, lesion or rash.  Neurological:     General: No focal deficit present.     Mental Status: She is alert and oriented to person, place, and time.     Sensory: No sensory deficit.     Motor: No weakness or abnormal muscle tone.     Coordination: Coordination normal.  Psychiatric:  Mood and Affect: Mood normal.        Behavior: Behavior normal.      Imaging: No results found.

## 2023-02-28 NOTE — Patient Instructions (Addendum)
[facility] Physiatry Discharge Instructions  *At any time if you have questions or concerns they can be answered by calling [phone]  All Patients: You may experience an increase in your symptoms for the first 2 days (it can take 2 days to 2 weeks for the steroid/cortisone to have its maximal effect). You may use ice to the site for the first 24 hours; 20 minutes on and 20 minutes off and may use heat after that time. You may resume and continue your current pain medications. If you need a refill please contact the prescribing physician. You may resume your medications if any were stopped for the procedure. You may shower but no swimming, tub bath or Jacuzzi for 24 hours. Please remove bandage after 4 hours. You may resume light activities as tolerated. If you had Spine Injection, you should not drive for the next 3 hours due to anesthetics used in the procedure. Please have someone drive for you.  *If you have had sedation, Valium, Xanax, or lorazepam: Do not drive or use public transportation for 24 hours, do not operating hazardous machinery or make important personal/business decisions for 24 hours.  POSSIBLE STEROID SIDE EFFECTS: If experienced these should only last for a short period. Change in menstrual flow  Edema in (swelling)  Increased appetite Skin flushing (redness)  Skin rash/acne  Thrush (oral) Vaginitis    Increased sweating  Depression Increased blood glucose levels Cramping and leg/calf  Euphoria (feeling happy)  POSSIBLE PROCEDURE SIDE EFFECTS: Please call our office if concerned. Increased pain Increased numbness/tingling  Headache Nausea/vomiting Hematoma (bruising/bleeding) Edema (swelling at the site) Weakness  Infection (red/drainage at site) Fever greater than 100.2F  *In the event of a headache after epidural steroid injection: Drink plenty of fluids, especially water and try to lay flat when possible. If the headache does not get better after a few days  or as always if concerned please call the office. [facility] Discharge Instructions  *At any time if you have questions or concerns they can be answered by calling [phone]  All Patients: You may experience an increase in your symptoms for the first 2 days (it can take 2 days to 2 weeks for the steroid/cortisone to have its maximal effect). You may use ice to the site for the first 24 hours; 20 minutes on and 20 minutes off and may use heat after that time. You may resume and continue your current pain medications. If you need a refill please contact the prescribing physician. You may resume your medications if any were stopped for the procedure. You may shower but no swimming, tub bath or Jacuzzi for 24 hours. Please remove bandage after 4 hours. You may resume light activities as tolerated. If you had Spine Injection, you should not drive for the next 3 hours due to anesthetics used in the procedure. Please have someone drive for you.  *If you have had sedation, Valium, Xanax, or lorazepam: Do not drive or use public transportation for 24 hours, do not operating hazardous machinery or make important personal/business decisions for 24 hours.  POSSIBLE STEROID SIDE EFFECTS: If experienced these should only last for a short period. Change in menstrual flow  Edema in (swelling)  Increased appetite Skin flushing (redness)  Skin rash/acne  Thrush (oral) Vaginitis    Increased sweating  Depression Increased blood glucose levels Cramping and leg/calf  Euphoria (feeling happy)  POSSIBLE PROCEDURE SIDE EFFECTS: Please call our office if concerned. Increased pain Increased numbness/tingling  Headache Nausea/vomiting  Hematoma (bruising/bleeding) Edema (swelling at the site) Weakness  Infection (red/drainage at site) Fever greater than 100.107F  *In the event of a headache after epidural steroid injection: Drink plenty of fluids, especially water and try to lay flat when possible. If  the headache does not get better after a few days or as always if concerned please call the office.

## 2023-02-28 NOTE — Procedures (Signed)
Lumbosacral Transforaminal Epidural Steroid Injection - Sub-Pedicular Approach with Fluoroscopic Guidance  [name]      Date of Birth: May 06, 1950 MRN: [id number] PCP: Irena Reichmann, DO      Visit Date: 02/28/2023   Universal Protocol:    Date/Time: 02/28/2023  Consent Given By: the patient  Position: PRONE  Additional Comments: Vital signs were monitored before and after the procedure. Patient was prepped and draped in the usual sterile fashion. The correct patient, procedure, and site was verified.   Injection Procedure Details:   Procedure diagnoses: Lumbar radiculopathy [M54.16]    Meds Administered:  Meds ordered this encounter  Medications   methylPREDNISolone acetate (DEPO-MEDROL) injection 40 mg    Laterality: Right  Location/Site: L4  Needle:5.0 in., 22 ga.  Short bevel or Quincke spinal needle  Needle Placement: Transforaminal  Findings:    -Comments: Excellent flow of contrast along the nerve, nerve root and into the epidural space.  Procedure Details: After squaring off the end-plates to get a true AP view, the C-arm was positioned so that an oblique view of the foramen as noted above was visualized. The target area is just inferior to the "nose of the scotty dog" or sub pedicular. The soft tissues overlying this structure were infiltrated with 2-3 ml. of 1% Lidocaine without Epinephrine.  The spinal needle was inserted toward the target using a "trajectory" view along the fluoroscope beam.  Under AP and lateral visualization, the needle was advanced so it did not puncture dura and was located close the 6 O'Clock position of the pedical in AP tracterory. Biplanar projections were used to confirm position. Aspiration was confirmed to be negative for CSF and/or blood. A 1-2 ml. volume of Isovue-250 was injected and flow of contrast was noted at each level. Radiographs were obtained for documentation purposes.   After attaining the desired flow of  contrast documented above, a 0.5 to 1.0 ml test dose of 0.25% Marcaine was injected into each respective transforaminal space.  The patient was observed for 90 seconds post injection.  After no sensory deficits were reported, and normal lower extremity motor function was noted,   the above injectate was administered so that equal amounts of the injectate were placed at each foramen (level) into the transforaminal epidural space.   Additional Comments:  No complications occurred Dressing: 2 x 2 sterile gauze and Band-Aid    Post-procedure details: Patient was observed during the procedure. Post-procedure instructions were reviewed.  Patient left the clinic in stable condition.

## 2023-03-04 ENCOUNTER — Encounter: Payer: Self-pay | Admitting: Physical Therapy

## 2023-03-04 ENCOUNTER — Ambulatory Visit (INDEPENDENT_AMBULATORY_CARE_PROVIDER_SITE_OTHER): Payer: 59 | Admitting: Physical Therapy

## 2023-03-04 DIAGNOSIS — M6281 Muscle weakness (generalized): Secondary | ICD-10-CM | POA: Diagnosis not present

## 2023-03-04 DIAGNOSIS — M25662 Stiffness of left knee, not elsewhere classified: Secondary | ICD-10-CM

## 2023-03-04 DIAGNOSIS — R2689 Other abnormalities of gait and mobility: Secondary | ICD-10-CM | POA: Diagnosis not present

## 2023-03-04 NOTE — Therapy (Signed)
 OUTPATIENT PHYSICAL THERAPY THORACOLUMBAR EVALUATION   Patient Name: Rachel Burns MRN: [id number] DOB:January 13, 1951, 72 y.o., female Today's Date: 03/04/2023  END OF SESSION:  PT End of Session - 03/04/23 1613     Visit Number 1    Number of Visits 10    Date for PT Re-Evaluation 04/15/23    PT Start Time 1515    PT Stop Time 1602    PT Time Calculation (min) 47 min    Activity Tolerance Patient tolerated treatment well    Behavior During Therapy WFL for tasks assessed/performed             Past Medical History:  Diagnosis Date   DDD (degenerative disc disease), lumbar    Diabetes mellitus without complication (HCC)    Fatty liver    GERD (gastroesophageal reflux disease)    Hypertension    Past Surgical History:  Procedure Laterality Date   ARM WOUND REPAIR / CLOSURE Right    arm was broken in 3 places   CESAREAN SECTION     CHOLECYSTECTOMY     DECOMPRESSIVE LUMBAR LAMINECTOMY LEVEL 1 N/A 11/25/2022   Procedure: L4-5 LUMBAR LAMINECTOMY WITH BILATERAL FORAMINOTOMIES;  Surgeon: Georgina Ozell LABOR, MD;  Location: [facility] OR;  Service: Orthopedics;  Laterality: N/A;   DILATION AND CURETTAGE OF UTERUS     Patient Active Problem List   Diagnosis Date Noted   Lumbar stenosis with neurogenic claudication 11/25/2022   Spinal stenosis of lumbar region with neurogenic claudication 01/17/2016   Lumbar radiculopathy 01/17/2016    PCP: Gerome Brunet, DO   REFERRING PROVIDER: Georgina Ozell LABOR, MD   REFERRING DIAG: M54.16 (ICD-10-CM) - Lumbar radiculopathy .  Rationale for Evaluation and Treatment: Rehabilitation  THERAPY DIAG:  Muscle weakness (generalized)  Other abnormalities of gait and mobility  Stiffness of left knee, not elsewhere classified  ONSET DATE: S/P L4-5 Lumbar laminectomy 11/25/22  SUBJECTIVE:                                                                                                                                                                                            SUBJECTIVE STATEMENT: She had  L4- L5 lumbar laminectomy and still having back and leg pain. She had injection 02/28/23 and had good response to this and pain is much lower since. Still with some difficulty and pain with any prolonged walking >1 minute. Pain runs  PERTINENT HISTORY:  PMH includes: HTN, DM, cholecystectomy, lumbar laminectomy  PAIN:  NPRS scale: 1-2/10  after injection, before injection it was 2-7/10 Pain location:low back and down Rt posterior hip to the knee Pain description: numbness, tingling burning  Aggravating factors: prolonged standing or walking Relieving factors: rest  PRECAUTIONS: None  RED FLAGS: None   WEIGHT BEARING RESTRICTIONS: No  FALLS:  Has patient fallen in last 6 months? Yes. Number of falls 1, It was nothing, tripped on compute cord, does not have fear of falling or poor balance   OCCUPATION: attorney  PLOF: Independent  PATIENT GOALS: walk longer, needs to be able to walk 2 blocks [place]. and reduce the pain in her leg  NEXT MD VISIT: 04/27/22  OBJECTIVE:  Note: Objective measures were completed at Evaluation unless otherwise noted.  DIAGNOSTIC FINDINGS:  XRs of the lumbar spine from 02/13/2023  showing spondylolisthesis at L4/5 that shifts about 1.5 mm between flexion and extension views.  Disc height loss at L4/5.  No other significant degenerative changes seen.  No fracture or dislocation noted.  PATIENT SURVEYS:  Eval: FOTO 49% functional, goal is 56%  COGNITION: Overall cognitive status: Within functional limits for tasks assessed     SENSATION: WFL  MUSCLE LENGTH: Hamstrings: Right is mod tight, left is minimal tigh  LUMBAR ROM:   AROM eval  Flexion 75% no pain  Extension 75%, feels good  Right lateral flexion   Left lateral flexion   Right rotation 50% but some pain  Left rotation 50%, no pain   (Blank rows = not tested)  LOWER EXTREMITY MMT:    MMT Right eval  Left eval  Hip flexion 4 4  Hip extension    Hip abduction 4 4  Hip adduction    Hip internal rotation    Hip external rotation    Knee flexion 5 5  Knee extension 4+ 4+  Ankle dorsiflexion    Ankle plantarflexion    Ankle inversion    Ankle eversion     (Blank rows = not tested)  LUMBAR SPECIAL TESTS:  Slump test: Positive on Rt SLR negative Long axis distraction + (reduced pain)  FUNCTIONAL TESTS:  Eval: 5 times sit to stand: 16 seconds without UE support  GAIT: Eval: Distance walked: walks without assistance but limited to about 1 minute of walking due to pain  TODAY'S TREATMENT:  Eval Mechanical lumbar traction X 15 minutes 75-60# HEP creation and review with demonstration education, see below for details    PATIENT EDUCATION: Education details: HEP, PT plan of care Person educated: Patient Education method: Explanation, Demonstration, Verbal cues, and Handouts Education comprehension: verbalized understanding and needs further education   HOME EXERCISE PROGRAM: Access Code: [email or web address] URL: [email or web address] Date: 03/04/2023 Prepared by: Redell Moose  Exercises - Standing Lumbar Extension at Wall - Forearms  - 2 x daily - 6 x weekly - 1 sets - 10 reps - 3 sec hold - Hooklying Single Knee to Chest Stretch  - 2 x daily - 6 x weekly - 1 sets - 2 reps - 30 hold - supine IT band and piriformis stretch  - 2 x daily - 6 x weekly - 1 sets - 2 reps - 30 hold - Supine Lower Trunk Rotation  - 2 x daily - 6 x weekly - 1 sets - 10 reps - 5 sec hold - Standing 'L' Stretch at Counter  - 2 x daily - 6 x weekly - 1 sets - 10 reps - 5 hold - Modified Thomas Stretch  - 2 x daily - 6 x weekly - 3 sets - 20-30 hold - Sit to Stand with Armchair  - 2 x daily - 6 x weekly -  1 sets - 5 reps  ASSESSMENT:  CLINICAL IMPRESSION: Patient referred to PT S/P L4-5 Lumbar laminectomy 11/25/22. Still with some Right sided radiculopathy and increased pain particularly with  prolonged walking or standing. I did provide her with education on gradual walking program of trying to do 5 short tolerable walks a day instead of on one long walk that aggravates her back. She did appear to get some relief from lumbar traction today and previous injection has helped her as well. Patient will benefit from skilled PT to address below impairments, limitations and improve overall function.  OBJECTIVE IMPAIRMENTS: decreased activity tolerance, difficulty walking, decreased endurance, decreased mobility, decreased ROM, decreased strength, impaired flexibility, postural dysfunction, and pain.  ACTIVITY LIMITATIONS: lifting, carry, locomotion, cleaning, community activity  PERSONAL FACTORS: see above PMH are also affecting patient's functional outcome.  REHAB POTENTIAL: Good  CLINICAL DECISION MAKING: Stable/uncomplicated  EVALUATION COMPLEXITY: Low    GOALS: Short term PT Goals Target date: 04/01/2023   Pt will be I and compliant with HEP. Baseline:  Goal status: New Pt will decrease pain by 25% overall with walking up to 5 minutes Baseline: Goal status: New  Long term PT goals Target date:04/15/2023   Pt will improve lumbar ROM to [facility] to improve functional mobility Baseline: Goal status: New Pt will improve  hip/knee strength to at least 4+/5 MMT to improve functional strength Baseline: Goal status: New Pt will improve FOTO to at least 56% functional to show improved function Baseline: Goal status: New Pt will reduce pain to overall less than 3/10 for walking 2 blocks as she needs to do this for work Goal status: New Pt will improve 5 times sit to stand test to less than 13 seconds without UE support Baseline:16:37 Goal status: New  PLAN: PT FREQUENCY: 1-3 times per week   PT DURATION: 4-6 weeks  PLANNED INTERVENTIONS (unless contraindicated): aquatic PT, Canalith repositioning, cryotherapy, Electrical stimulation, Iontophoresis with 4 mg/ml dexamethasome,  Moist heat, traction, Ultrasound, gait training, Therapeutic exercise, balance training, neuromuscular re-education, patient/family education,  manual techniques, passive ROM, dry needling, taping, vasopnuematic device, vestibular, spinal manipulations, joint manipulations 97110-Therapeutic exercises, 97530- Therapeutic activity, [id number]- Neuromuscular re-education, 97535- Self Care, [id number]- Manual therapy, [id number]- Gait training, and 97012- Traction (mechanical)  PLAN FOR NEXT SESSION: how was traction and repeat if desired. Gradual standing and walking progressions without aggravating pain.  Redell JONELLE Moose, PT,DPT 03/04/2023, 4:14 PM

## 2023-03-11 ENCOUNTER — Encounter: Payer: Self-pay | Admitting: Physical Therapy

## 2023-03-11 ENCOUNTER — Ambulatory Visit (INDEPENDENT_AMBULATORY_CARE_PROVIDER_SITE_OTHER): Payer: Self-pay | Admitting: Physical Therapy

## 2023-03-11 DIAGNOSIS — M25662 Stiffness of left knee, not elsewhere classified: Secondary | ICD-10-CM

## 2023-03-11 DIAGNOSIS — R2689 Other abnormalities of gait and mobility: Secondary | ICD-10-CM

## 2023-03-11 DIAGNOSIS — M6281 Muscle weakness (generalized): Secondary | ICD-10-CM

## 2023-03-11 NOTE — Therapy (Signed)
 OUTPATIENT PHYSICAL THERAPY THORACOLUMBAR EVALUATION   Patient Name: Rachel Burns MRN: [id number] DOB:November 19, 1950, 73 y.o., female Today's Date: 03/11/2023  END OF SESSION:  PT End of Session - 03/11/23 1456     Visit Number 2    Number of Visits 10    Date for PT Re-Evaluation 04/15/23    PT Start Time 1430    PT Stop Time 1500    PT Time Calculation (min) 30 min    Activity Tolerance Patient tolerated treatment well    Behavior During Therapy WFL for tasks assessed/performed              Past Medical History:  Diagnosis Date   DDD (degenerative disc disease), lumbar    Diabetes mellitus without complication (HCC)    Fatty liver    GERD (gastroesophageal reflux disease)    Hypertension    Past Surgical History:  Procedure Laterality Date   ARM WOUND REPAIR / CLOSURE Right    arm was broken in 3 places   CESAREAN SECTION     CHOLECYSTECTOMY     DECOMPRESSIVE LUMBAR LAMINECTOMY LEVEL 1 N/A 11/25/2022   Procedure: L4-5 LUMBAR LAMINECTOMY WITH BILATERAL FORAMINOTOMIES;  Surgeon: Georgina Ozell LABOR, MD;  Location: [facility] OR;  Service: Orthopedics;  Laterality: N/A;   DILATION AND CURETTAGE OF UTERUS     Patient Active Problem List   Diagnosis Date Noted   Lumbar stenosis with neurogenic claudication 11/25/2022   Spinal stenosis of lumbar region with neurogenic claudication 01/17/2016   Lumbar radiculopathy 01/17/2016    PCP: Gerome Brunet, DO   REFERRING PROVIDER: Georgina Ozell LABOR, MD   REFERRING DIAG: M54.16 (ICD-10-CM) - Lumbar radiculopathy .  Rationale for Evaluation and Treatment: Rehabilitation  THERAPY DIAG:  Muscle weakness (generalized)  Other abnormalities of gait and mobility  Stiffness of left knee, not elsewhere classified  ONSET DATE: S/P L4-5 Lumbar laminectomy 11/25/22  SUBJECTIVE:                                                                                                                                                                                            SUBJECTIVE STATEMENT: Pt reporting good response to traction and wishing to try it again this visit.     Eval:  She had  L4- L5 lumbar laminectomy and still having back and leg pain. She had injection 02/28/23 and had good response to this and pain is much lower since. Still with some difficulty and pain with any prolonged walking >1 minute. Pain runs  PERTINENT HISTORY:  PMH includes: HTN, DM, cholecystectomy, lumbar laminectomy  PAIN:  NPRS scale: 1-2/10  after injection,  before injection it was 2-7/10 Pain location:low back and down Rt posterior hip to the knee Pain description: numbness, tingling burning Aggravating factors: prolonged standing or walking Relieving factors: rest  PRECAUTIONS: None  RED FLAGS: None   WEIGHT BEARING RESTRICTIONS: No  FALLS:  Has patient fallen in last 6 months? Yes. Number of falls 1, It was nothing, tripped on compute cord, does not have fear of falling or poor balance   OCCUPATION: attorney  PLOF: Independent  PATIENT GOALS: walk longer, needs to be able to walk 2 blocks [place]. and reduce the pain in her leg  NEXT MD VISIT: 04/27/22  OBJECTIVE:  Note: Objective measures were completed at Evaluation unless otherwise noted.  DIAGNOSTIC FINDINGS:  XRs of the lumbar spine from 02/13/2023  showing spondylolisthesis at L4/5 that shifts about 1.5 mm between flexion and extension views.  Disc height loss at L4/5.  No other significant degenerative changes seen.  No fracture or dislocation noted.  PATIENT SURVEYS:  Eval: FOTO 49% functional, goal is 56%  COGNITION: Overall cognitive status: Within functional limits for tasks assessed     SENSATION: WFL  MUSCLE LENGTH: Hamstrings: Right is mod tight, left is minimal tigh  LUMBAR ROM:   AROM eval  Flexion 75% no pain  Extension 75%, feels good  Right lateral flexion   Left lateral flexion   Right rotation 50% but some pain  Left  rotation 50%, no pain   (Blank rows = not tested)  LOWER EXTREMITY MMT:    MMT Right eval Left eval  Hip flexion 4 4  Hip extension    Hip abduction 4 4  Hip adduction    Hip internal rotation    Hip external rotation    Knee flexion 5 5  Knee extension 4+ 4+  Ankle dorsiflexion    Ankle plantarflexion    Ankle inversion    Ankle eversion     (Blank rows = not tested)  LUMBAR SPECIAL TESTS:  Slump test: Positive on Rt SLR negative Long axis distraction + (reduced pain)  FUNCTIONAL TESTS:  Eval: 5 times sit to stand: 16 seconds without UE support  GAIT: Eval: Distance walked: walks without assistance but limited to about 1 minute of walking due to pain  TODAY'S TREATMENT:  03/11/23:  Mechanical traction: x 25 minutes, 85# - 70# Verbally reviewed all pt's HEP from evaluation   Eval Mechanical lumbar traction X 15 minutes 75-60# HEP creation and review with demonstration education, see below for details    PATIENT EDUCATION: Education details: HEP, PT plan of care Person educated: Patient Education method: Explanation, Demonstration, Verbal cues, and Handouts Education comprehension: verbalized understanding and needs further education   HOME EXERCISE PROGRAM: Access Code: [email or web address] URL: [email or web address] Date: 03/04/2023 Prepared by: Redell Moose  Exercises - Standing Lumbar Extension at Wall - Forearms  - 2 x daily - 6 x weekly - 1 sets - 10 reps - 3 sec hold - Hooklying Single Knee to Chest Stretch  - 2 x daily - 6 x weekly - 1 sets - 2 reps - 30 hold - supine IT band and piriformis stretch  - 2 x daily - 6 x weekly - 1 sets - 2 reps - 30 hold - Supine Lower Trunk Rotation  - 2 x daily - 6 x weekly - 1 sets - 10 reps - 5 sec hold - Standing 'L' Stretch at Counter  - 2 x daily - 6 x weekly - 1 sets -  10 reps - 5 hold - Modified Thomas Stretch  - 2 x daily - 6 x weekly - 3 sets - 20-30 hold - Sit to Stand with Armchair  - 2 x daily - 6 x  weekly - 1 sets - 5 reps  ASSESSMENT:  CLINICAL IMPRESSION:  Pt arriving today reporting 1-2/10 in her low back. Pt reporting good response to mechanical traction last visit and reports her HEP is going well. This visit we increased pt's traction duration as well as minimum and maximum pull and pt with good tolerance and reporting no pain following session. Continue skilled PT interventions to maximize pt's  function.   OBJECTIVE IMPAIRMENTS: decreased activity tolerance, difficulty walking, decreased endurance, decreased mobility, decreased ROM, decreased strength, impaired flexibility, postural dysfunction, and pain.  ACTIVITY LIMITATIONS: lifting, carry, locomotion, cleaning, community activity  PERSONAL FACTORS: see above PMH are also affecting patient's functional outcome.  REHAB POTENTIAL: Good  CLINICAL DECISION MAKING: Stable/uncomplicated  EVALUATION COMPLEXITY: Low    GOALS: Short term PT Goals Target date: 04/01/2023   Pt will be I and compliant with HEP. Baseline:  Goal status: on-going 03/11/23  Pt will decrease pain by 25% overall with walking up to 5 minutes Baseline: Goal status: on-going 03/11/23  Long term PT goals Target date:04/15/2023   Pt will improve lumbar ROM to [facility] to improve functional mobility Baseline: Goal status: New Pt will improve  hip/knee strength to at least 4+/5 MMT to improve functional strength Baseline: Goal status: New Pt will improve FOTO to at least 56% functional to show improved function Baseline: Goal status: New Pt will reduce pain to overall less than 3/10 for walking 2 blocks as she needs to do this for work Goal status: New Pt will improve 5 times sit to stand test to less than 13 seconds without UE support Baseline:16:37 Goal status: New  PLAN: PT FREQUENCY: 1-3 times per week   PT DURATION: 4-6 weeks  PLANNED INTERVENTIONS (unless contraindicated): aquatic PT, Canalith repositioning, cryotherapy, Electrical  stimulation, Iontophoresis with 4 mg/ml dexamethasome, Moist heat, traction, Ultrasound, gait training, Therapeutic exercise, balance training, neuromuscular re-education, patient/family education,  manual techniques, passive ROM, dry needling, taping, vasopnuematic device, vestibular, spinal manipulations, joint manipulations 97110-Therapeutic exercises, 97530- Therapeutic activity, 97112- Neuromuscular re-education, 97535- Self Care, [id number]- Manual therapy, [id number]- Gait training, and 97012- Traction (mechanical)  PLAN FOR NEXT SESSION:Traction,  Gradual standing and walking progressions without aggravating pain. Update HEP as needed  Delon JONELLE Lunger, PT, MPT 03/11/2023, 3:11 PM

## 2023-03-14 ENCOUNTER — Telehealth: Payer: Self-pay

## 2023-03-14 ENCOUNTER — Encounter: Payer: 59 | Admitting: Physical Therapy

## 2023-03-14 NOTE — Telephone Encounter (Signed)
 VOB submitted for Orthovisc, bilateral knee

## 2023-03-21 ENCOUNTER — Ambulatory Visit (INDEPENDENT_AMBULATORY_CARE_PROVIDER_SITE_OTHER): Payer: BC Managed Care – PPO | Admitting: Physical Therapy

## 2023-03-21 ENCOUNTER — Encounter: Payer: Self-pay | Admitting: Physical Therapy

## 2023-03-21 DIAGNOSIS — M6281 Muscle weakness (generalized): Secondary | ICD-10-CM

## 2023-03-21 DIAGNOSIS — R2689 Other abnormalities of gait and mobility: Secondary | ICD-10-CM | POA: Diagnosis not present

## 2023-03-21 NOTE — Therapy (Signed)
OUTPATIENT PHYSICAL THERAPY THORACOLUMBAR TREATMENT   Patient Name: Rachel Burns MRN: [id number] DOB:May 13, 1950, 73 y.o., female Today's Date: 03/21/2023  END OF SESSION:  PT End of Session - 03/21/23 1433     Visit Number 3    Number of Visits 10    Date for PT Re-Evaluation 04/15/23    PT Start Time 1432    PT Stop Time 1514    PT Time Calculation (min) 42 min    Activity Tolerance Patient tolerated treatment well    Behavior During Therapy WFL for tasks assessed/performed               Past Medical History:  Diagnosis Date   DDD (degenerative disc disease), lumbar    Diabetes mellitus without complication (HCC)    Fatty liver    GERD (gastroesophageal reflux disease)    Hypertension    Past Surgical History:  Procedure Laterality Date   ARM WOUND REPAIR / CLOSURE Right    "arm was broken in 3 places"   CESAREAN SECTION     CHOLECYSTECTOMY     DECOMPRESSIVE LUMBAR LAMINECTOMY LEVEL 1 N/A 11/25/2022   Procedure: L4-5 LUMBAR LAMINECTOMY WITH BILATERAL FORAMINOTOMIES;  Surgeon: London Sheer, MD;  Location: [facility] OR;  Service: Orthopedics;  Laterality: N/A;   DILATION AND CURETTAGE OF UTERUS     Patient Active Problem List   Diagnosis Date Noted   Lumbar stenosis with neurogenic claudication 11/25/2022   Spinal stenosis of lumbar region with neurogenic claudication 01/17/2016   Lumbar radiculopathy 01/17/2016    PCP: Irena Reichmann, DO   REFERRING PROVIDER: London Sheer, MD   REFERRING DIAG: M54.16 (ICD-10-CM) - Lumbar radiculopathy .  Rationale for Evaluation and Treatment: Rehabilitation  THERAPY DIAG:  Muscle weakness (generalized)  Other abnormalities of gait and mobility  ONSET DATE: S/P L4-5 Lumbar laminectomy 11/25/22  SUBJECTIVE:                                                                                                                                                                                           SUBJECTIVE  STATEMENT:  I'm not feeling great today, not feeling as good as I was last time but not as bad as I was originally. There was one day where I missed my lyrica and felt absolutely awful, back on track and better somewhat. Have been getting on the stationary bike on weekends- try to during the week but I'm so busy it can be difficult. For me when it hits 5/10 I need to stop and sit down.     Eval:  She had  L4- L5 lumbar laminectomy and  still having back and leg pain. She had injection 02/28/23 and had good response to this and pain is much lower since. Still with some difficulty and pain with any prolonged walking >1 minute. Pain runs  PERTINENT HISTORY:  PMH includes: HTN, DM, cholecystectomy, lumbar laminectomy  PAIN:  NPRS scale: 2-3/10 now, worst in past week up to 6-7/10 Pain location:low back and down Rt posterior hip to the knee Pain description: numbness, tingling burning Aggravating factors: prolonged standing or walking Relieving factors: rest  PRECAUTIONS: None  RED FLAGS: None   WEIGHT BEARING RESTRICTIONS: No  FALLS:  Has patient fallen in last 6 months? Yes. Number of falls 1, "It was nothing, tripped on compute cord, does not have fear of falling or poor balance   OCCUPATION: attorney  PLOF: Independent  PATIENT GOALS: walk longer, needs to be able to walk 2 blocks [place] [employer]. and reduce the pain in her leg  NEXT MD VISIT: 04/27/22  OBJECTIVE:  Note: Objective measures were completed at Evaluation unless otherwise noted.  DIAGNOSTIC FINDINGS:  XRs of the lumbar spine from 02/13/2023  "showing spondylolisthesis at L4/5 that shifts about 1.5 mm between flexion and extension views.  Disc height loss at L4/5.  No other significant degenerative changes seen.  No fracture or dislocation noted."  PATIENT SURVEYS:  Eval: FOTO 49% functional, goal is 56%  COGNITION: Overall cognitive status: Within functional limits for tasks  assessed     SENSATION: WFL  MUSCLE LENGTH: Hamstrings: Right is mod tight, left is minimal tigh  LUMBAR ROM:   AROM eval  Flexion 75% no pain  Extension 75%, feels good  Right lateral flexion   Left lateral flexion   Right rotation 50% but some pain  Left rotation 50%, no pain   (Blank rows = not tested)  LOWER EXTREMITY MMT:    MMT Right eval Left eval  Hip flexion 4 4  Hip extension    Hip abduction 4 4  Hip adduction    Hip internal rotation    Hip external rotation    Knee flexion 5 5  Knee extension 4+ 4+  Ankle dorsiflexion    Ankle plantarflexion    Ankle inversion    Ankle eversion     (Blank rows = not tested)  LUMBAR SPECIAL TESTS:  Slump test: Positive on Rt SLR negative Long axis distraction + (reduced pain)  FUNCTIONAL TESTS:  Eval: 5 times sit to stand: 16 seconds without UE support  GAIT: Eval: Distance walked: walks without assistance but limited to about 1 minute of walking due to pain  TODAY'S TREATMENT:   03/21/23  TherEx  Nustep L4 x6 minutes BLEs only Self-lumbar traction with swiss ball x4 minutes (added to HEP)    Manual  Manual traction supine for lumbar spine    Selfcare  Lots of education today on anatomy of affected regions, reasoning why traction of any form is beneficial and relieves pressure from entrapped nerve, education on pain relieving, anatomy and biomechanics of spinal region involved in her pain, really encouraged exercises at home (for example self lumbar traction with swiss ball) that are relieving and that she can do in between PT sessions to get maximal traction/pressure relief      03/11/23:  Mechanical traction: x 25 minutes, 85# - 70# Verbally reviewed all pt's HEP from evaluation   Eval Mechanical lumbar traction X 15 minutes 75-60# HEP creation and review with demonstration education, see below for details  PATIENT EDUCATION: Education details: HEP, PT plan of care Person educated:  Patient Education method: Explanation, Demonstration, Verbal cues, and Handouts Education comprehension: verbalized understanding and needs further education   HOME EXERCISE PROGRAM: Access Code: [email or web address] + self lumbar traction over swiss ball (not in medbridge) URL: [email or web address] Date: 03/04/2023 Prepared by: Ivery Quale  Exercises - Standing Lumbar Extension at Wall - Forearms  - 2 x daily - 6 x weekly - 1 sets - 10 reps - 3 sec hold - Hooklying Single Knee to Chest Stretch  - 2 x daily - 6 x weekly - 1 sets - 2 reps - 30 hold - supine IT band and piriformis stretch  - 2 x daily - 6 x weekly - 1 sets - 2 reps - 30 hold - Supine Lower Trunk Rotation  - 2 x daily - 6 x weekly - 1 sets - 10 reps - 5 sec hold - Standing 'L' Stretch at Counter  - 2 x daily - 6 x weekly - 1 sets - 10 reps - 5 hold - Modified Thomas Stretch  - 2 x daily - 6 x weekly - 3 sets - 20-30 hold - Sit to Stand with Armchair  - 2 x daily - 6 x weekly - 1 sets - 5 reps  ASSESSMENT:  CLINICAL IMPRESSION:   Pt arrives today doing OK, pain is back up a bit today. We tried getting a bit more active as tolerated today, also increased focus on core work. Performed manual traction and tried self lumbar traction on swiss ball to try to get her a little more relief at home. Did have LLD, tried heel lift but this did not change pain/altered gait in a negative way so took it back out. Really encouraged her to consistently do pressure relieving exercises at home- if she is just getting nerve decompression at PT with traction but does not have a way to decompress this area on her own at home, I'm worried that she will continue to have severe pain. Will continue to progress as tolerated.   OBJECTIVE IMPAIRMENTS: decreased activity tolerance, difficulty walking, decreased endurance, decreased mobility, decreased ROM, decreased strength, impaired flexibility, postural dysfunction, and pain.  ACTIVITY LIMITATIONS:  lifting, carry, locomotion, cleaning, community activity  PERSONAL FACTORS: see above PMH are also affecting patient's functional outcome.  REHAB POTENTIAL: Good  CLINICAL DECISION MAKING: Stable/uncomplicated  EVALUATION COMPLEXITY: Low    GOALS: Short term PT Goals Target date: 04/01/2023   Pt will be I and compliant with HEP. Baseline:  Goal status: on-going 03/11/23  Pt will decrease pain by 25% overall with walking up to 5 minutes Baseline: Goal status: on-going 03/11/23  Long term PT goals Target date:04/15/2023   Pt will improve lumbar ROM to [facility] to improve functional mobility Baseline: Goal status: New Pt will improve  hip/knee strength to at least 4+/5 MMT to improve functional strength Baseline: Goal status: New Pt will improve FOTO to at least 56% functional to show improved function Baseline: Goal status: New Pt will reduce pain to overall less than 3/10 for walking 2 blocks as she needs to do this for work Goal status: New Pt will improve 5 times sit to stand test to less than 13 seconds without UE support Baseline:16:37 Goal status: New  PLAN: PT FREQUENCY: 1-3 times per week   PT DURATION: 4-6 weeks  PLANNED INTERVENTIONS (unless contraindicated): aquatic PT, Canalith repositioning, cryotherapy, Electrical stimulation, Iontophoresis with 4 mg/ml dexamethasome, Moist heat, traction, Ultrasound, gait training,  Therapeutic exercise, balance training, neuromuscular re-education, patient/family education,  manual techniques, passive ROM, dry needling, taping, vasopnuematic device, vestibular, spinal manipulations, joint manipulations 97110-Therapeutic exercises, 97530- Therapeutic activity, 97112- Neuromuscular re-education, 97535- Self Care, 08657- Manual therapy, 97116- Gait training, and 97012- Traction (mechanical)  PLAN FOR NEXT SESSION:Traction PRN,   Gradual standing and walking progressions without aggravating pain. Update HEP as needed.   Nedra Hai, PT, DPT 03/21/23 3:39 PM

## 2023-03-28 ENCOUNTER — Other Ambulatory Visit: Payer: Self-pay | Admitting: Orthopedic Surgery

## 2023-03-28 ENCOUNTER — Encounter: Payer: 59 | Admitting: Physical Therapy

## 2023-04-10 ENCOUNTER — Encounter: Payer: Self-pay | Admitting: Rehabilitative and Restorative Service Providers"

## 2023-04-10 ENCOUNTER — Ambulatory Visit (INDEPENDENT_AMBULATORY_CARE_PROVIDER_SITE_OTHER): Payer: BC Managed Care – PPO | Admitting: Rehabilitative and Restorative Service Providers"

## 2023-04-10 DIAGNOSIS — M25662 Stiffness of left knee, not elsewhere classified: Secondary | ICD-10-CM | POA: Diagnosis not present

## 2023-04-10 DIAGNOSIS — M6281 Muscle weakness (generalized): Secondary | ICD-10-CM

## 2023-04-10 DIAGNOSIS — R2689 Other abnormalities of gait and mobility: Secondary | ICD-10-CM

## 2023-04-10 NOTE — Therapy (Signed)
 OUTPATIENT PHYSICAL THERAPY THORACOLUMBAR TREATMENT   Patient Name: Rachel Burns MRN: [id number] DOB:12/28/1950, 73 y.o., female Today's Date: 04/10/2023  END OF SESSION:  PT End of Session - 04/10/23 1656     Visit Number 4    Number of Visits 10    Date for PT Re-Evaluation 04/15/23    PT Start Time 1600    PT Stop Time 1645    PT Time Calculation (min) 45 min    Activity Tolerance Patient tolerated treatment well;No increased pain;Patient limited by pain    Behavior During Therapy [facility] for tasks assessed/performed              Past Medical History:  Diagnosis Date   DDD (degenerative disc disease), lumbar    Diabetes mellitus without complication (HCC)    Fatty liver    GERD (gastroesophageal reflux disease)    Hypertension    Past Surgical History:  Procedure Laterality Date   ARM WOUND REPAIR / CLOSURE Right    arm was broken in 3 places   CESAREAN SECTION     CHOLECYSTECTOMY     DECOMPRESSIVE LUMBAR LAMINECTOMY LEVEL 1 N/A 11/25/2022   Procedure: L4-5 LUMBAR LAMINECTOMY WITH BILATERAL FORAMINOTOMIES;  Surgeon: Georgina Ozell LABOR, MD;  Location: [facility] OR;  Service: Orthopedics;  Laterality: N/A;   DILATION AND CURETTAGE OF UTERUS     Patient Active Problem List   Diagnosis Date Noted   Lumbar stenosis with neurogenic claudication 11/25/2022   Spinal stenosis of lumbar region with neurogenic claudication 01/17/2016   Lumbar radiculopathy 01/17/2016    PCP: Gerome Brunet, DO   REFERRING PROVIDER: Georgina Ozell LABOR, MD   REFERRING DIAG: M54.16 (ICD-10-CM) - Lumbar radiculopathy .  Rationale for Evaluation and Treatment: Rehabilitation  THERAPY DIAG:  Muscle weakness (generalized)  Other abnormalities of gait and mobility  Stiffness of left knee, not elsewhere classified  ONSET DATE: S/P L4-5 Lumbar laminectomy 11/25/22  SUBJECTIVE:                                                                                                                                                                                            SUBJECTIVE STATEMENT: Cayle is much better as compared to before surgery.  Standing and walking is better, although walking is still limited.  Right sided peripheral symptoms to the knee limit walking in particular.    Eval:  She had  L4- L5 lumbar laminectomy and still having back and leg pain. She had injection 02/28/23 and had good response to this and pain is much lower since. Still with some difficulty and pain with any prolonged walking >1 minute.  Pain runs  PERTINENT HISTORY:  PMH includes: HTN, DM, cholecystectomy, lumbar laminectomy  PAIN:  NPRS scale: 2-7/10 this week Pain location: low back and down Rt posterior hip to the knee Pain description: numbness, tingling, burning Aggravating factors: prolonged standing or walking Relieving factors: rest  PRECAUTIONS: None  RED FLAGS: None   WEIGHT BEARING RESTRICTIONS: No  FALLS:  Has patient fallen in last 6 months? Yes. Number of falls 1, It was nothing, tripped on compute cord, does not have fear of falling or poor balance   OCCUPATION: attorney  PLOF: Independent  PATIENT GOALS: walk longer, needs to be able to walk 2 blocks [place] office [place]. and reduce the pain in her leg  NEXT MD VISIT: 04/27/22  OBJECTIVE:  Note: Objective measures were completed at Evaluation unless otherwise noted.  DIAGNOSTIC FINDINGS:  XRs of the lumbar spine from 02/13/2023  showing spondylolisthesis at L4/5 that shifts about 1.5 mm between flexion and extension views.  Disc height loss at L4/5.  No other significant degenerative changes seen.  No fracture or dislocation noted.  PATIENT SURVEYS:  Eval: FOTO 49% functional, goal is 56%  COGNITION: Overall cognitive status: Within functional limits for tasks assessed     SENSATION: WFL  MUSCLE LENGTH: Hamstrings: Right is mod tight, left is minimal tigh  LUMBAR ROM:   AROM eval  Flexion 75% no pain   Extension 75%, feels good  Right lateral flexion   Left lateral flexion   Right rotation 50% but some pain  Left rotation 50%, no pain   (Blank rows = not tested)  LOWER EXTREMITY MMT:    MMT Right eval Left eval  Hip flexion 4 4  Hip extension    Hip abduction 4 4  Hip adduction    Hip internal rotation    Hip external rotation    Knee flexion 5 5  Knee extension 4+ 4+  Ankle dorsiflexion    Ankle plantarflexion    Ankle inversion    Ankle eversion     (Blank rows = not tested)  LUMBAR SPECIAL TESTS:  Slump test: Positive on Rt SLR negative Long axis distraction + (reduced pain)  FUNCTIONAL TESTS:  Eval: 5 times sit to stand: 16 seconds without UE support  GAIT: Eval: Distance walked: walks without assistance but limited to about 1 minute of walking due to pain  TODAY'S TREATMENT:  04/10/2023 Lumbar extension AROM 10 x 3 seconds (hips forward, hands on gluteals) Single knee to chest 2 x 30 seconds bilateral (other leg straight) Supine IT Band and piriformis stretch 2 x 30 seconds Modified Thomas stretch 2 x 20 seconds  Discussed pelvic rotation and L stretch  Functional Activities: Review spine anatomy, surgery and imaging with spine model and PT. Practical golfer's lift, log roll for bed mobility and spine education for other ADLs Yoga bridge for bed mobility 10 x 5 seconds Attempted hip hike for walking but limited by knee pain 5 x sit to stand   03/21/23 TherEx Nustep L4 x6 minutes BLEs only Self-lumbar traction with swiss ball x4 minutes (added to HEP)  Manual Manual traction supine for lumbar spine   Selfcare Lots of education today on anatomy of affected regions, reasoning why traction of any form is beneficial and relieves pressure from entrapped nerve, education on pain relieving, anatomy and biomechanics of spinal region involved in her pain, really encouraged exercises at home (for example self lumbar traction with swiss ball) that are  relieving and that she  can do in between PT sessions to get maximal traction/pressure relief    03/11/23:  Mechanical traction: x 25 minutes, 85# - 70# Verbally reviewed all pt's HEP from evaluation   Eval Mechanical lumbar traction X 15 minutes 75-60# HEP creation and review with demonstration education, see below for details   PATIENT EDUCATION: Education details: HEP, PT plan of care Person educated: Patient Education method: Explanation, Demonstration, Verbal cues, and Handouts Education comprehension: verbalized understanding and needs further education   HOME EXERCISE PROGRAM: Access Code: [email or web address] URL: [email or web address] Date: 04/10/2023 Prepared by: Lamar Ivory  Exercises - Standing Lumbar Extension at Wall - Forearms  - 2 x daily - 6 x weekly - 1 sets - 10 reps - 3 sec hold - Hooklying Single Knee to Chest Stretch  - 2 x daily - 6 x weekly - 1 sets - 2 reps - 30 hold - supine IT band and piriformis stretch  - 2 x daily - 6 x weekly - 1 sets - 2 reps - 30 hold - Supine Lower Trunk Rotation  - 2 x daily - 6 x weekly - 1 sets - 10 reps - 5 sec hold - Standing 'L' Stretch at Counter  - 2 x daily - 6 x weekly - 1 sets - 10 reps - 5 hold - Modified Thomas Stretch  - 2 x daily - 6 x weekly - 3 sets - 20-30 hold - Sit to Stand with Armchair  - 2 x daily - 6 x weekly - 1 sets - 5 reps - Yoga Bridge  - 2 x daily - 7 x weekly - 1 sets - 10 reps - 5 seconds hold  ASSESSMENT:  CLINICAL IMPRESSION:  Shallen is very pleased with the relief she gets from traction/stretching over the Swiss ball.  She reports good supine HEP compliance although additional strength work is recommended as her program is mostly flexibility.  Good lumbar paraspinals strengthening with bridging today although she will benefit from additional work on hip abductors and quadratus lumborum strength.  Nabiha noted she has a busy week coming up and she will look to schedule additional visits the week  of February 17.  OBJECTIVE IMPAIRMENTS: decreased activity tolerance, difficulty walking, decreased endurance, decreased mobility, decreased ROM, decreased strength, impaired flexibility, postural dysfunction, and pain.  ACTIVITY LIMITATIONS: lifting, carry, locomotion, cleaning, community activity  PERSONAL FACTORS: see above PMH are also affecting patient's functional outcome.  REHAB POTENTIAL: Good  CLINICAL DECISION MAKING: Stable/uncomplicated  EVALUATION COMPLEXITY: Low    GOALS: Short term PT Goals Target date: 04/01/2023   Pt will be I and compliant with HEP. Baseline:  Goal status: Met 04/10/2023  Pt will decrease pain by 25% overall with walking up to 5 minutes Baseline: Goal status: On Going 04/10/2023  Long term PT goals Target date:04/15/2023   Pt will improve lumbar ROM to [facility] to improve functional mobility Baseline: Goal status: New Pt will improve  hip/knee strength to at least 4+/5 MMT to improve functional strength Baseline: Goal status: New Pt will improve FOTO to at least 56% functional to show improved function Baseline: Goal status: New Pt will reduce pain to overall less than 3/10 for walking 2 blocks as she needs to do this for work Goal status: New Pt will improve 5 times sit to stand test to less than 13 seconds without UE support Baseline:16:37 Goal status: New  PLAN: PT FREQUENCY: 1-3 times per week   PT DURATION: 4-6 weeks  PLANNED INTERVENTIONS (unless contraindicated):  aquatic PT, Canalith repositioning, cryotherapy, Electrical stimulation, Iontophoresis with 4 mg/ml dexamethasome, Moist heat, traction, Ultrasound, gait training, Therapeutic exercise, balance training, neuromuscular re-education, patient/family education,  manual techniques, passive ROM, dry needling, taping, vasopnuematic device, vestibular, spinal manipulations, joint manipulations 97110-Therapeutic exercises, 97530- Therapeutic activity, 97112- Neuromuscular  re-education, 97535- Self Care, [id number]- Manual therapy, 97116- Gait training, and 97012- Traction (mechanical)  PLAN FOR NEXT SESSION: Progress lumbar, hip abductors and quadratus lumborum strength.  Will need FOTO and re-cert for visits beyond 04/15/2023  Myer LELON Ivory PT, MPT 04/10/23 5:04 PM

## 2023-04-14 ENCOUNTER — Telehealth: Payer: Self-pay

## 2023-04-14 NOTE — Telephone Encounter (Signed)
 Completed PA form has been faxed to [facility] at [phone]. PA pending

## 2023-04-18 ENCOUNTER — Telehealth: Payer: Self-pay | Admitting: Orthopedic Surgery

## 2023-04-18 NOTE — Telephone Encounter (Signed)
Patient called. Would like April to call her.

## 2023-04-21 NOTE — Telephone Encounter (Signed)
 Talked with patient and advised her that I am currently awaiting approval from her insurance.  Once approved, I will call her to schedule.  Patient voiced that she understands.

## 2023-04-23 ENCOUNTER — Telehealth: Payer: Self-pay

## 2023-04-23 ENCOUNTER — Encounter: Payer: Self-pay | Admitting: Rehabilitative and Restorative Service Providers"

## 2023-04-23 NOTE — Telephone Encounter (Signed)
 Talked with Simonne Maffucci [employer] to check status of PA for [facility], bilateral knee and per Amil Amen PA had been denied due to not receiving any office notes. Refaxed office notes to [phone] Pending Case# [id number]

## 2023-04-28 ENCOUNTER — Telehealth: Payer: Self-pay | Admitting: Orthopaedic Surgery

## 2023-04-28 ENCOUNTER — Ambulatory Visit (INDEPENDENT_AMBULATORY_CARE_PROVIDER_SITE_OTHER): Payer: BC Managed Care – PPO | Admitting: Orthopedic Surgery

## 2023-04-28 DIAGNOSIS — M5416 Radiculopathy, lumbar region: Secondary | ICD-10-CM | POA: Diagnosis not present

## 2023-04-28 NOTE — Telephone Encounter (Signed)
 Patient is here. Would like to know if the gel injection has been approved for her knee?

## 2023-04-28 NOTE — Progress Notes (Signed)
 Orthopedic Surgery Post-operative Office Visit   Procedure: L4/5 laminectomy Date of Surgery: 11/25/2022 (~5 months post-op)   Assessment: Patient is a 73 y.o. who has an L4/5 spondylolisthesis and is still having radiating leg pain     Plan: -Operative plans complete -No spine specific restrictions -Pain management: PT, tylenol, lyrica 100mg  BID -Patient has done well with injections in that past. The last one gave her 90% relief so recommended another one today -If conservative treatments do not provide her with satisfactory relief, discussed L4/5 ALIF and PSIF as a treatment option for her -Would want an MRI prior to any surgical intervention to  -Return to office in 6 weeks, x-rays needed at next visit: AP/lateral/flex/ex lumbar    ___________________________________________________________________________     Subjective: Patient still with low back pain that radiates into her right leg. It goes into the lateral thigh and then cross over the knee to the medial leg. Pain is worse with walking. Has difficulty doing all of her shopping at the store as a result of the pain. She got an injection with Dr. Alvester Morin and got 90% relief with that injection. She got several months of relief with this injection and is interested in trying another.      Objective:   General: no acute distress, appropriate affect Neurologic: alert, answering questions appropriately, following commands Respiratory: unlabored breathing on room air Skin: incision is well healed   MSK (spine):   -Strength exam                                                   Left                  Right   EHL                              5/5                  5/5 TA                                 5/5                  5/5 GSC                             5/5                  5/5 Knee extension            5/5                  5/5 Hip flexion                    5/5                  5/5   -Sensory exam                            Sensation intact to light touch in L3-S1 nerve distributions of bilateral lower extremities   Imaging: XRs of the lumbar spine from 02/13/2023 were previously independently reviewed and  interpreted, showing spondylolisthesis at L4/5 that shifts about 1.5 mm between flexion and extension views.  Disc height loss at L4/5.  No other significant degenerative changes seen.  No fracture or dislocation noted.     Patient name: Rachel Burns Patient MRN: [id number] Date of visit: 04/28/23

## 2023-04-28 NOTE — Telephone Encounter (Signed)
 Hey, I haven't gotten anything from her insurance since I spoke with them on 04/23/2023.  I will call her once I get an approval.    Thank you

## 2023-04-30 ENCOUNTER — Encounter: Payer: BC Managed Care – PPO | Admitting: Physical Therapy

## 2023-05-07 ENCOUNTER — Encounter: Payer: BC Managed Care – PPO | Admitting: Rehabilitative and Restorative Service Providers"

## 2023-05-13 ENCOUNTER — Encounter: Payer: Self-pay | Admitting: Physician Assistant

## 2023-05-13 ENCOUNTER — Ambulatory Visit (INDEPENDENT_AMBULATORY_CARE_PROVIDER_SITE_OTHER): Admitting: Physician Assistant

## 2023-05-13 ENCOUNTER — Other Ambulatory Visit: Payer: Self-pay

## 2023-05-13 DIAGNOSIS — M170 Bilateral primary osteoarthritis of knee: Secondary | ICD-10-CM | POA: Diagnosis not present

## 2023-05-13 MED ORDER — HYALURONAN 30 MG/2ML IX SOSY
30.0000 mg | PREFILLED_SYRINGE | INTRA_ARTICULAR | Status: AC | PRN
Start: 2023-05-13 — End: 2023-05-13
  Administered 2023-05-13: 30 mg via INTRA_ARTICULAR

## 2023-05-13 MED ORDER — LIDOCAINE HCL 1 % IJ SOLN
3.0000 mL | INTRAMUSCULAR | Status: AC | PRN
Start: 2023-05-13 — End: 2023-05-13
  Administered 2023-05-13: 3 mL

## 2023-05-13 MED ORDER — BUPIVACAINE HCL 0.25 % IJ SOLN
0.6600 mL | INTRAMUSCULAR | Status: AC | PRN
Start: 2023-05-13 — End: 2023-05-13
  Administered 2023-05-13: .66 mL via INTRA_ARTICULAR

## 2023-05-13 NOTE — Progress Notes (Signed)
 Office Visit Note   [name]           Date of Birth: 14-May-1950           MRN: [id number] Visit Date: 05/13/2023              Requested by: Irena Reichmann, DO [address] [place],  [place] [address] PCP: Irena Reichmann, DO   Assessment & Plan: Visit Diagnoses:  1. Bilateral primary osteoarthritis of knee     Plan: Impression is bilateral knee osteoarthritis.  Today, we proceeded with bilateral knee Orthovisc No. 1 injection.  She tolerated these well.  Follow-up next week for Orthovisc No. 2 to both knees.  Call with concerns or questions.  Follow-Up Instructions: Return in about 1 week (around 05/20/2023).   Orders:  Orders Placed This Encounter  Procedures   Large Joint Inj   No orders of the defined types were placed in this encounter.     Procedures: Large Joint Inj: bilateral knee on 05/13/2023 9:31 AM Indications: pain Details: 22 G needle, anterolateral approach Medications (Right): 0.66 mL bupivacaine 0.25 %; 3 mL lidocaine 1 %; 30 mg Hyaluronan 30 MG/2ML Medications (Left): 0.66 mL bupivacaine 0.25 %; 3 mL lidocaine 1 %; 30 mg Hyaluronan 30 MG/2ML      Clinical Data: No additional findings.   Subjective: Chief Complaint  Patient presents with   Right Knee - Pain   Left Knee - Pain    HPI patient is a pleasant 73 year old female with bilateral knee osteoarthritis who comes in today for Orthovisc No. 1 injection to both knees.     Objective: Vital Signs: There were no vitals taken for this visit.    Ortho Exam unchanged bilateral knee exam  Specialty Comments:  EXAM: MRI LUMBAR SPINE WITHOUT CONTRAST   TECHNIQUE: Multiplanar, multisequence MR imaging of the lumbar spine was performed. No intravenous contrast was administered.   COMPARISON:  08/07/2015 report, images not available   FINDINGS: Segmentation:  Standard.   Alignment: Degenerative anterolisthesis at L4-5 measuring 9 mm. Milder L5-S1  anterolisthesis due to the same.   Vertebrae:  No fracture, evidence of discitis, or bone lesion.   Conus medullaris and cauda equina: Conus extends to the L1-2 level. Conus and cauda equina appear normal.   Paraspinal and other soft tissues: Negative for perispinal mass or inflammation.   Disc levels:   L2-L3: Mild foraminal disc bulging   L3-L4: Degenerative facet spurring on both sides. Posterior epidural fat expansion with mild thecal sac stenosis.   L4-L5: Severe facet osteoarthritis with spurring and anterolisthesis. The disc is narrowed and bulging with biforaminal herniation. Advanced spinal and biforaminal stenosis.   L5-S1:Facet degeneration with spurring and anterolisthesis. Disc space narrowing and bulging without neural compression.   IMPRESSION: 1. 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 2. Advanced spinal and biforaminal stenosis at L4-5.     Electronically Signed   By: Tiburcio Pea M.D.   On: 04/02/2022 04:29  Imaging: No results found.   PMFS History: Patient Active Problem List   Diagnosis Date Noted   Lumbar stenosis with neurogenic claudication 11/25/2022   Spinal stenosis of lumbar region with neurogenic claudication 01/17/2016   Lumbar radiculopathy 01/17/2016   Past Medical History:  Diagnosis Date   DDD (degenerative disc disease), lumbar    Diabetes mellitus without complication (HCC)    Fatty liver    GERD (gastroesophageal reflux disease)  Hypertension     History reviewed. No pertinent family history.  Past Surgical History:  Procedure Laterality Date   ARM WOUND REPAIR / CLOSURE Right    "arm was broken in 3 places"   CESAREAN SECTION     CHOLECYSTECTOMY     DECOMPRESSIVE LUMBAR LAMINECTOMY LEVEL 1 N/A 11/25/2022   Procedure: L4-5 LUMBAR LAMINECTOMY WITH BILATERAL FORAMINOTOMIES;  Surgeon: London Sheer, MD;  Location: [facility] OR;  Service: Orthopedics;   Laterality: N/A;   DILATION AND CURETTAGE OF UTERUS     Social History   Occupational History   Not on file  Tobacco Use   Smoking status: Never   Smokeless tobacco: Never  Vaping Use   Vaping status: Never Used  Substance and Sexual Activity   Alcohol use: Yes    Comment: maybe 1 drink twice a month   Drug use: Not Currently   Sexual activity: Not on file

## 2023-05-14 ENCOUNTER — Encounter: Payer: BC Managed Care – PPO | Admitting: Rehabilitative and Restorative Service Providers"

## 2023-05-17 ENCOUNTER — Other Ambulatory Visit: Payer: Self-pay | Admitting: Orthopedic Surgery

## 2023-05-19 ENCOUNTER — Ambulatory Visit (INDEPENDENT_AMBULATORY_CARE_PROVIDER_SITE_OTHER): Admitting: Physical Medicine and Rehabilitation

## 2023-05-19 ENCOUNTER — Other Ambulatory Visit: Payer: Self-pay

## 2023-05-19 VITALS — BP 136/86 | HR 75

## 2023-05-19 DIAGNOSIS — M5416 Radiculopathy, lumbar region: Secondary | ICD-10-CM

## 2023-05-19 DIAGNOSIS — M961 Postlaminectomy syndrome, not elsewhere classified: Secondary | ICD-10-CM

## 2023-05-19 MED ORDER — METHYLPREDNISOLONE ACETATE 40 MG/ML IJ SUSP
40.0000 mg | Freq: Once | INTRAMUSCULAR | Status: AC
  Administered 2023-05-19: 40 mg

## 2023-05-19 NOTE — Patient Instructions (Signed)

## 2023-05-19 NOTE — Progress Notes (Signed)
 Rachel Burns [facility] - 73 y.o. female MRN [id number]  Date of birth: 1950-09-30  Office Visit Note: Visit Date: 05/19/2023 PCP: Irena Reichmann, DO Referred by: London Sheer, MD  Subjective: Chief Complaint  Patient presents with   Lower Back - Pain   HPI:  Rachel Burns is a 73 y.o. female who comes in today at the request of Dr. Willia Craze for planned Right L4-5 Lumbar Transforaminal epidural steroid injection with fluoroscopic guidance.  The patient has failed conservative care including home exercise, medications, time and activity modification.  This injection will be diagnostic and hopefully therapeutic.  Please see requesting physician notes for further details and justification. Could ultimately consider spinal cord stimulator trial depending on further surgical evaluation.    ROS Otherwise per HPI.  Assessment & Plan: Visit Diagnoses:    ICD-10-CM   1. Radiculopathy, lumbar region  M54.16 XR C-ARM NO REPORT    Epidural Steroid injection    methylPREDNISolone acetate (DEPO-MEDROL) injection 40 mg    2. Post laminectomy syndrome  M96.1       Plan: No additional findings.   Meds & Orders:  Meds ordered this encounter  Medications   methylPREDNISolone acetate (DEPO-MEDROL) injection 40 mg    Orders Placed This Encounter  Procedures   XR C-ARM NO REPORT   Epidural Steroid injection    Follow-up: Return for visit to requesting provider as needed.   Procedures: No procedures performed  Lumbosacral Transforaminal Epidural Steroid Injection - Sub-Pedicular Approach with Fluoroscopic Guidance  [name]      Date of Birth: 06/18/1950 MRN: [id number] PCP: Irena Reichmann, DO      Visit Date: 05/19/2023   Universal Protocol:    Date/Time: 05/19/2023  Consent Given By: the patient  Position: PRONE  Additional Comments: Vital signs were monitored before and after the procedure. Patient was prepped and draped in the usual sterile fashion. The correct  patient, procedure, and site was verified.   Injection Procedure Details:   Procedure diagnoses: Radiculopathy, lumbar region [M54.16]    Meds Administered:  Meds ordered this encounter  Medications   methylPREDNISolone acetate (DEPO-MEDROL) injection 40 mg    Laterality: Right  Location/Site: L4  Needle:5.0 in., 22 ga.  Short bevel or Quincke spinal needle  Needle Placement: Transforaminal  Findings:    -Comments: Excellent flow of contrast along the nerve, nerve root and into the epidural space.  Procedure Details: After squaring off the end-plates to get a true AP view, the C-arm was positioned so that an oblique view of the foramen as noted above was visualized. The target area is just inferior to the "nose of the scotty dog" or sub pedicular. The soft tissues overlying this structure were infiltrated with 2-3 ml. of 1% Lidocaine without Epinephrine.  The spinal needle was inserted toward the target using a "trajectory" view along the fluoroscope beam.  Under AP and lateral visualization, the needle was advanced so it did not puncture dura and was located close the 6 O'Clock position of the pedical in AP tracterory. Biplanar projections were used to confirm position. Aspiration was confirmed to be negative for CSF and/or blood. A 1-2 ml. volume of Isovue-250 was injected and flow of contrast was noted at each level. Radiographs were obtained for documentation purposes.   After attaining the desired flow of contrast documented above, a 0.5 to 1.0 ml test dose of 0.25% Marcaine was injected into each respective transforaminal space.  The patient was observed for 90  seconds post injection.  After no sensory deficits were reported, and normal lower extremity motor function was noted,   the above injectate was administered so that equal amounts of the injectate were placed at each foramen (level) into the transforaminal epidural space.   Additional Comments:  The patient tolerated  the procedure well Dressing: 2 x 2 sterile gauze and Band-Aid    Post-procedure details: Patient was observed during the procedure. Post-procedure instructions were reviewed.  Patient left the clinic in stable condition.    Clinical History: EXAM: MRI LUMBAR SPINE WITHOUT CONTRAST   TECHNIQUE: Multiplanar, multisequence MR imaging of the lumbar spine was performed. No intravenous contrast was administered.   COMPARISON:  08/07/2015 report, images not available   FINDINGS: Segmentation:  Standard.   Alignment: Degenerative anterolisthesis at L4-5 measuring 9 mm. Milder L5-S1 anterolisthesis due to the same.   Vertebrae:  No fracture, evidence of discitis, or bone lesion.   Conus medullaris and cauda equina: Conus extends to the L1-2 level. Conus and cauda equina appear normal.   Paraspinal and other soft tissues: Negative for perispinal mass or inflammation.   Disc levels:   L2-L3: Mild foraminal disc bulging   L3-L4: Degenerative facet spurring on both sides. Posterior epidural fat expansion with mild thecal sac stenosis.   L4-L5: Severe facet osteoarthritis with spurring and anterolisthesis. The disc is narrowed and bulging with biforaminal herniation. Advanced spinal and biforaminal stenosis.   L5-S1:Facet degeneration with spurring and anterolisthesis. Disc space narrowing and bulging without neural compression.   IMPRESSION: 1. 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 2. Advanced spinal and biforaminal stenosis at L4-5.     Electronically Signed   By: Tiburcio Pea M.D.   On: 04/02/2022 04:29     Objective:  VS:  HT:    WT:   BMI:     BP:136/86  HR:75bpm  TEMP: ( )  RESP:  Physical Exam Vitals and nursing note reviewed.  Constitutional:      General: She is not in acute distress.    Appearance: Normal appearance. She is not ill-appearing.  HENT:     Head: Normocephalic  and atraumatic.     Right Ear: External ear normal.     Left Ear: External ear normal.  Eyes:     Extraocular Movements: Extraocular movements intact.  Cardiovascular:     Rate and Rhythm: Normal rate.     Pulses: Normal pulses.  Pulmonary:     Effort: Pulmonary effort is normal. No respiratory distress.  Abdominal:     General: There is no distension.     Palpations: Abdomen is soft.  Musculoskeletal:        General: Tenderness present.     Cervical back: Neck supple.     Right lower leg: No edema.     Left lower leg: No edema.     Comments: Patient has good distal strength with no pain over the greater trochanters.  No clonus or focal weakness.  Skin:    Findings: No erythema, lesion or rash.  Neurological:     General: No focal deficit present.     Mental Status: She is alert and oriented to person, place, and time.     Sensory: No sensory deficit.     Motor: No weakness or abnormal muscle tone.     Coordination: Coordination normal.  Psychiatric:        Mood and Affect: Mood normal.  Behavior: Behavior normal.      Imaging: No results found.

## 2023-05-19 NOTE — Procedures (Signed)
 Lumbosacral Transforaminal Epidural Steroid Injection - Sub-Pedicular Approach with Fluoroscopic Guidance  [name]      Date of Birth: 22-Mar-1950 MRN: [id number] PCP: Irena Reichmann, DO      Visit Date: 05/19/2023   Universal Protocol:    Date/Time: 05/19/2023  Consent Given By: the patient  Position: PRONE  Additional Comments: Vital signs were monitored before and after the procedure. Patient was prepped and draped in the usual sterile fashion. The correct patient, procedure, and site was verified.   Injection Procedure Details:   Procedure diagnoses: Radiculopathy, lumbar region [M54.16]    Meds Administered:  Meds ordered this encounter  Medications   methylPREDNISolone acetate (DEPO-MEDROL) injection 40 mg    Laterality: Right  Location/Site: L4  Needle:5.0 in., 22 ga.  Short bevel or Quincke spinal needle  Needle Placement: Transforaminal  Findings:    -Comments: Excellent flow of contrast along the nerve, nerve root and into the epidural space.  Procedure Details: After squaring off the end-plates to get a true AP view, the C-arm was positioned so that an oblique view of the foramen as noted above was visualized. The target area is just inferior to the "nose of the scotty dog" or sub pedicular. The soft tissues overlying this structure were infiltrated with 2-3 ml. of 1% Lidocaine without Epinephrine.  The spinal needle was inserted toward the target using a "trajectory" view along the fluoroscope beam.  Under AP and lateral visualization, the needle was advanced so it did not puncture dura and was located close the 6 O'Clock position of the pedical in AP tracterory. Biplanar projections were used to confirm position. Aspiration was confirmed to be negative for CSF and/or blood. A 1-2 ml. volume of Isovue-250 was injected and flow of contrast was noted at each level. Radiographs were obtained for documentation purposes.   After attaining the desired  flow of contrast documented above, a 0.5 to 1.0 ml test dose of 0.25% Marcaine was injected into each respective transforaminal space.  The patient was observed for 90 seconds post injection.  After no sensory deficits were reported, and normal lower extremity motor function was noted,   the above injectate was administered so that equal amounts of the injectate were placed at each foramen (level) into the transforaminal epidural space.   Additional Comments:  The patient tolerated the procedure well Dressing: 2 x 2 sterile gauze and Band-Aid    Post-procedure details: Patient was observed during the procedure. Post-procedure instructions were reviewed.  Patient left the clinic in stable condition.

## 2023-05-19 NOTE — Progress Notes (Signed)
 Pain Scale   Average Pain 3        +Driver, -BT, -Dye Allergies.

## 2023-05-20 ENCOUNTER — Encounter: Payer: Self-pay | Admitting: Orthopaedic Surgery

## 2023-05-20 ENCOUNTER — Ambulatory Visit (INDEPENDENT_AMBULATORY_CARE_PROVIDER_SITE_OTHER): Admitting: Orthopaedic Surgery

## 2023-05-20 DIAGNOSIS — M1712 Unilateral primary osteoarthritis, left knee: Secondary | ICD-10-CM

## 2023-05-20 DIAGNOSIS — M1711 Unilateral primary osteoarthritis, right knee: Secondary | ICD-10-CM

## 2023-05-20 NOTE — Progress Notes (Signed)
 Patient ID: Rachel Burns, female   DOB: 13-Sep-1950, 73 y.o.   MRN: [id number]  Patient comes in today for bilateral knee Orthovisc injections.  We decided to hold off on injection till next week because her knee is felt worse after the first injections.  We decided to let things calm down little bit more.  She will see [place] back next week.

## 2023-05-27 ENCOUNTER — Encounter: Payer: Self-pay | Admitting: Physician Assistant

## 2023-05-27 ENCOUNTER — Ambulatory Visit: Admitting: Physician Assistant

## 2023-05-27 DIAGNOSIS — M170 Bilateral primary osteoarthritis of knee: Secondary | ICD-10-CM | POA: Diagnosis not present

## 2023-05-27 MED ORDER — HYALURONAN 30 MG/2ML IX SOSY
30.0000 mg | PREFILLED_SYRINGE | INTRA_ARTICULAR | Status: AC | PRN
Start: 2023-05-27 — End: 2023-05-27
  Administered 2023-05-27: 30 mg via INTRA_ARTICULAR

## 2023-05-27 MED ORDER — HYALURONAN 30 MG/2ML IX SOSY
30.0000 mg | PREFILLED_SYRINGE | INTRA_ARTICULAR | Status: AC | PRN
  Administered 2023-05-27: 30 mg via INTRA_ARTICULAR

## 2023-05-27 NOTE — Progress Notes (Signed)
   Procedure Note  [name]             Date of Birth: [birth year]           MRN: [id number]             Visit Date: 05/27/2023  Procedures: Visit Diagnoses:  1. Bilateral primary osteoarthritis of knee     Large Joint Inj: bilateral knee on 05/27/2023 4:07 PM Indications: pain Details: 22 G needle, anterolateral approach Medications (Right): 30 mg Hyaluronan 30 MG/2ML Medications (Left): 30 mg Hyaluronan 30 MG/2ML

## 2023-06-06 ENCOUNTER — Ambulatory Visit: Admitting: Physician Assistant

## 2023-06-09 ENCOUNTER — Ambulatory Visit: Payer: BC Managed Care – PPO | Admitting: Orthopedic Surgery

## 2023-06-11 ENCOUNTER — Encounter: Payer: Self-pay | Admitting: Orthopaedic Surgery

## 2023-06-11 ENCOUNTER — Ambulatory Visit: Admitting: Orthopedic Surgery

## 2023-06-11 ENCOUNTER — Ambulatory Visit: Admitting: Orthopaedic Surgery

## 2023-06-11 DIAGNOSIS — M170 Bilateral primary osteoarthritis of knee: Secondary | ICD-10-CM

## 2023-06-11 MED ORDER — HYALURONAN 30 MG/2ML IX SOSY
30.0000 mg | PREFILLED_SYRINGE | INTRA_ARTICULAR | Status: AC | PRN
Start: 2023-06-11 — End: 2023-06-11
  Administered 2023-06-11: 30 mg via INTRA_ARTICULAR

## 2023-06-11 MED ORDER — HYALURONAN 30 MG/2ML IX SOSY
30.0000 mg | PREFILLED_SYRINGE | INTRA_ARTICULAR | Status: AC | PRN
  Administered 2023-06-11: 30 mg via INTRA_ARTICULAR

## 2023-06-11 NOTE — Progress Notes (Signed)
   Procedure Note  [name]             Date of Birth: Feb 14, 1951           MRN: [id number]             Visit Date: 06/11/2023  Procedures: Visit Diagnoses:  1. Bilateral primary osteoarthritis of knee     Large Joint Inj: bilateral knee on 06/11/2023 2:50 PM Indications: pain Details: 22 G needle  Arthrogram: No  Medications (Right): 30 mg Hyaluronan 30 MG/2ML Medications (Left): 30 mg Hyaluronan 30 MG/2ML Outcome: tolerated well, no immediate complications Patient was prepped and draped in the usual sterile fashion.     Bilateral Orthovisc injections performed today.

## 2023-06-12 ENCOUNTER — Ambulatory Visit: Admitting: Orthopedic Surgery

## 2023-06-12 DIAGNOSIS — M5416 Radiculopathy, lumbar region: Secondary | ICD-10-CM | POA: Diagnosis not present

## 2023-06-12 NOTE — Progress Notes (Signed)
 Orthopedic Surgery Post-operative Office Visit   Procedure: L4/5 laminectomy Date of Surgery: 11/25/2022 (~6.5 months post-op)   Assessment: Patient is a 73 y.o. who has an L4/5 spondylolisthesis and is still having radiating leg pain     Plan: -Operative plans complete -No spine specific restrictions -Pain management: PT, tylenol, lyrica 100mg  BID - Patient has tried over 6 weeks of conservative treatment including Tylenol, Lyrica, steroid injections so recommended MRI to evaluate for radiculopathy -Return to office in 2 months, x-rays needed at next visit: AP/lateral/flex/ex lumbar    ___________________________________________________________________________     Subjective: Patient has been getting knee injections with my partner Dr. Deno Etienne.  She says she finds those helpful.  She continues to have low back pain that radiates into the bilateral lower extremities.  Is more painful on the right side.  She feels a going along the lateral aspect and then crossing over the knee to the medial leg.  Pain is worse with walking and gets better if she sits down.  She had previously gotten good relief with an injection but this last injection provided her relief for a shorter duration.  She has not developed any new symptoms since she was last seen in the office.     Objective:   General: no acute distress, appropriate affect Neurologic: alert, answering questions appropriately, following commands Respiratory: unlabored breathing on room air Skin: incision is well healed   MSK (spine):   -Strength exam                                                   Left                  Right   EHL                              5/5                  5/5 TA                                 5/5                  5/5 GSC                             5/5                  5/5 Knee extension            5/5                  5/5 Hip flexion                    5/5                  5/5   -Sensory exam                            Sensation intact to light touch in L3-S1 nerve distributions of bilateral lower extremities   Imaging: XRs of the lumbar spine from 02/13/2023 were previously independently reviewed and  interpreted, showing spondylolisthesis at L4/5 that shifts about 1.5 mm between flexion and extension views.  Disc height loss at L4/5.  No other significant degenerative changes seen.  No fracture or dislocation noted.     Patient name: Rachel Burns Patient MRN: [id number] Date of visit: 06/12/23

## 2023-06-23 ENCOUNTER — Other Ambulatory Visit: Payer: Self-pay | Admitting: Orthopedic Surgery

## 2023-06-23 ENCOUNTER — Encounter: Payer: Self-pay | Admitting: Orthopedic Surgery

## 2023-06-27 ENCOUNTER — Other Ambulatory Visit

## 2023-07-02 ENCOUNTER — Encounter: Payer: Self-pay | Admitting: Orthopedic Surgery

## 2023-07-03 ENCOUNTER — Other Ambulatory Visit

## 2023-08-01 ENCOUNTER — Telehealth: Payer: Self-pay | Admitting: Radiology

## 2023-08-01 NOTE — Telephone Encounter (Signed)
 Patient left voicemail stating she was supposed to have MRI and it did not get authorized because more info was needed. She is supposed to see Dr. Sulema Endo on Monday and states that she has been waiting weeks to get the scan. She would like for someone to call her to discuss.   Jenette Mitchell- I see where you had sent our tax ID back in April.  Can you advise on update?  CB [id number]

## 2023-08-04 ENCOUNTER — Ambulatory Visit: Admitting: Orthopedic Surgery

## 2023-08-04 ENCOUNTER — Other Ambulatory Visit (INDEPENDENT_AMBULATORY_CARE_PROVIDER_SITE_OTHER): Payer: Self-pay

## 2023-08-04 ENCOUNTER — Encounter: Payer: Self-pay | Admitting: Orthopedic Surgery

## 2023-08-04 VITALS — BP 140/83 | HR 71 | Ht 62.0 in | Wt 183.0 lb

## 2023-08-04 DIAGNOSIS — M5416 Radiculopathy, lumbar region: Secondary | ICD-10-CM | POA: Diagnosis not present

## 2023-08-04 NOTE — Progress Notes (Unsigned)
 Orthopedic Surgery Post-operative Office Visit   Procedure: L4/5 laminectomy Date of Surgery: 11/25/2022 (~9 months post-op)   Assessment: Patient is a 73 y.o. who has an L4/5 spondylolisthesis and is still having radiating leg pain     Plan: -Patient has not had symptoms for over 6 months and has been under my treatment since that time. She has been trying tylenol  and lyrica  and injections as conservative treatment. She has been consistently on lyrica  since 03/28/2023. Since her pain has not resolved with conservative treatments, recommended an MRI of the lumbar spine to evaluate for radiculopathy -Pain management: PT, tylenol , lyrica  100mg  BID -Provided her with a referral to PT as an additional non-operative treatment to try -Return to office in 4 weeks, x-rays needed at next visit: none   ___________________________________________________________________________     Subjective: Patient continues to have back and bilateral lower extremity pain. The pain is worse in her right leg. She feels the pain in the lateral aspect of her thigh and it goes into the medial aspect of the leg. She has not noticed any relief with the conservative treatments tried so far. Pain is felt on a daily basis. There was no trauma or injury that preceded the onset of pain. Pain is worse with activity but it is still present with rest. The pain prevents her doing walking for more than a couple of minutes. She rates the pain as a 8/10 at its worst.      Objective:   General: no acute distress, appropriate affect Neurologic: alert, answering questions appropriately, following commands Respiratory: unlabored breathing on room air   MSK (spine):   -Strength exam                                                   Left                  Right   EHL                              5/5                  5/5 TA                                 5/5                  5/5 GSC                             5/5                   5/5 Knee extension            5/5                  5/5 Hip flexion                    5/5                  5/5   -Sensory exam  Sensation intact to light touch in L3-S1 nerve distributions of bilateral lower extremities   Imaging: XRs of the lumbar spine from 08/04/2023 were independently reviewed and interpreted, showing grade 2 spondylolisthesis at L4/5. Disc height loss at L4/5. There is kyphotic angulation through the L4/5 disc space. No other significant disc height loss seen. No fracture or dislocation seen.     Patient name: Rachel Burns Patient MRN: [id number] Date of visit: 08/04/23

## 2023-08-08 NOTE — Telephone Encounter (Signed)
 Spoke with pt, she has appt scheduled on 06/12 and has been scheduled a follow up apt with [facility]

## 2023-08-14 ENCOUNTER — Ambulatory Visit
Admission: RE | Admit: 2023-08-14 | Discharge: 2023-08-14 | Disposition: A | Discharge status: Home / Self Care | Source: Ambulatory Visit | Attending: Orthopedic Surgery | Admitting: Orthopedic Surgery

## 2023-08-14 DIAGNOSIS — M5416 Radiculopathy, lumbar region: Secondary | ICD-10-CM

## 2023-08-25 ENCOUNTER — Ambulatory Visit (INDEPENDENT_AMBULATORY_CARE_PROVIDER_SITE_OTHER): Admitting: Orthopedic Surgery

## 2023-08-25 ENCOUNTER — Encounter: Payer: Self-pay | Admitting: Physical Therapy

## 2023-08-25 ENCOUNTER — Ambulatory Visit (INDEPENDENT_AMBULATORY_CARE_PROVIDER_SITE_OTHER): Admitting: Physical Therapy

## 2023-08-25 DIAGNOSIS — M5416 Radiculopathy, lumbar region: Secondary | ICD-10-CM | POA: Diagnosis not present

## 2023-08-25 DIAGNOSIS — M25662 Stiffness of left knee, not elsewhere classified: Secondary | ICD-10-CM | POA: Diagnosis not present

## 2023-08-25 DIAGNOSIS — M6281 Muscle weakness (generalized): Secondary | ICD-10-CM

## 2023-08-25 DIAGNOSIS — R2689 Other abnormalities of gait and mobility: Secondary | ICD-10-CM

## 2023-08-25 DIAGNOSIS — M5459 Other low back pain: Secondary | ICD-10-CM

## 2023-08-25 NOTE — Progress Notes (Signed)
 Orthopedic Surgery Office Visit  Assessment: Patient is a 73 y.o. who has an L4/5 spondylolisthesis and bilateral foraminal stenosis causing radiculopathy     Plan: -Patient has now had symptoms for over 6 months and has been under my treatment since that time. She has been trying tylenol  and lyrica  and injections as conservative treatment. She has been consistently on lyrica  since 03/28/2023. Her pain has actually gotten worse of late, so recommended L4/5 ALIF and PSIF to treat the spondylolisthesis and indirectly decompress the bilateral foramen. After this discussion, patient elected to proceed.  -Patient will next be seen at date of surgery  The patient has symptoms consistent with lumbar radiculopathy. The patient's symptoms were not improving with conservative treatment so operative management was discussed in the form of L4/5 ALIF and PSIF. The risks including but not limited to dural tear, pseudarthosis, nerve root injury, paralysis, persistent pain, infection, bleeding, hardware failure, adjacent segment disease, vascular injury, bowel injury, dvt/pe, heart attack, death, fracture, and need for additional procedures were discussed with the patient. Since this will be an indirect decompression, there is a risk of persistent symptoms which the patient understood after explaining the reason. The benefit of the surgery would be improvement in the patient's radiating leg pain. I explained that back pain relief is not the goal of the surgery and it is not reliably alleviated with this surgery.The alternatives to surgical management were covered with the patient and included continued monitoring, physical therapy, over-the-counter pain medications, ambulatory aids, repeat injections, and activity modification. All the patient's questions were answered to her satisfaction. After this discussion, the patient expressed understanding and elected to proceed with surgical intervention.       ___________________________________________________________________________     Subjective: Patient continues to have low back and bilateral lower extremity pain.  She feels a going along the lateral aspect of the thigh and leg.  The right side is more symptomatic than the left.  She sometimes gets paresthesias in the same distribution as the pain.  She has been trying Tylenol , injections, Lyrica , anti-inflammatories over the last 6 months and has not noticed any improvement in her symptoms.  In fact, she has felt that her pain has gotten worse with the last month.  She limits her walking and activities during the day as a result of this pain.  She feels the pain on a daily basis. She has not developed any new symptoms since she was last seen in the office.      Objective:   General: no acute distress, appropriate affect Neurologic: alert, answering questions appropriately, following commands Respiratory: unlabored breathing on room air   MSK (spine):   -Strength exam                                                   Left                  Right   EHL                              5/5                  5/5 TA  5/5                  5/5 GSC                             5/5                  5/5 Knee extension            5/5                  5/5 Hip flexion                    5/5                  5/5   -Sensory exam                           Sensation intact to light touch in L3-S1 nerve distributions of bilateral lower extremities   Imaging: XRs of the lumbar spine from 08/04/2023 were previously independently reviewed and interpreted, showing grade 2 spondylolisthesis at L4/5. Disc height loss at L4/5. There is kyphotic angulation through the L4/5 disc space. No other significant disc height loss seen. No fracture or dislocation seen.    MRI of the lumbar spine from 08/14/2023 was independent reviewed and interpreted, showing spondylolisthesis at L4/5.   Bilateral foraminal stenosis at L4/5.  Laminectomy defect at L4/5 with no central or lateral recess stenosis.  Left-sided foraminal stenosis at L5/S1.  No other significant stenosis seen.    Patient name: Rachel Burns Patient MRN: [id number] Date of visit: 08/25/23

## 2023-08-25 NOTE — Therapy (Signed)
 OUTPATIENT PHYSICAL THERAPY THORACOLUMBAR EVALUATION   Patient Name: Rachel Burns MRN: [id number] DOB:[birth year], 73 y.o., female Today's Date: 08/25/2023  END OF SESSION:  PT End of Session - 08/25/23 1603     Visit Number 1    Number of Visits 16    Date for PT Re-Evaluation 10/24/23    PT Start Time 1415    PT Stop Time 1510    PT Time Calculation (min) 55 min    Activity Tolerance Patient tolerated treatment well;No increased pain    Behavior During Therapy WFL for tasks assessed/performed          Past Medical History:  Diagnosis Date   DDD (degenerative disc disease), lumbar    Diabetes mellitus without complication (HCC)    Fatty liver    GERD (gastroesophageal reflux disease)    Hypertension    Past Surgical History:  Procedure Laterality Date   ARM WOUND REPAIR / CLOSURE Right    arm was broken in 3 places   CESAREAN SECTION     CHOLECYSTECTOMY     DECOMPRESSIVE LUMBAR LAMINECTOMY LEVEL 1 N/A 11/25/2022   Procedure: L4-5 LUMBAR LAMINECTOMY WITH BILATERAL FORAMINOTOMIES;  Surgeon: Georgina Ozell LABOR, MD;  Location: [facility] OR;  Service: Orthopedics;  Laterality: N/A;   DILATION AND CURETTAGE OF UTERUS     Patient Active Problem List   Diagnosis Date Noted   Lumbar stenosis with neurogenic claudication 11/25/2022   Spinal stenosis of lumbar region with neurogenic claudication 01/17/2016   Lumbar radiculopathy 01/17/2016    PCP: Gerome Brunet, DO   REFERRING PROVIDER: Georgina Ozell LABOR, MD   REFERRING DIAG: M54.16 (ICD-10-CM) - Radiculopathy, lumbar region   Rationale for Evaluation and Treatment: Rehabilitation  THERAPY DIAG:  Other low back pain  Muscle weakness (generalized)  Other abnormalities of gait and mobility  Stiffness of left knee, not elsewhere classified  ONSET DATE: on going since last year, traction has helped in the past  SUBJECTIVE:  SUBJECTIVE STATEMENT: Patient arriving to therapy for evaluation of back pain and bilateral lower extremity pain. The pain is worse in her right leg. She feels the pain in the lateral aspect of her thigh and it goes into the medial aspect of the leg. Pain is experienced daily.  Pt unable to pin point specific event. Pain is worse with activity but it is still present with rest. Pt reporting unable to walk for more than a few minutes. Pt reporting worse pain is 8/10.  Pt stating traction has helped in the past and wishes to try traction again.   PERTINENT HISTORY:  See PMH, DDD, HTN S/P L4-5 Lumbar laminectomy 11/25/22   PAIN:  NPRS scale: 8/10 at worse, 2-3/10 at present Pain location: low back, QL, lumbar paraspinals Pain description: achy, radiating down Rt LE Aggravating factors: lifting, bending, walking prolonged   PRECAUTIONS: None  WEIGHT BEARING RESTRICTIONS: No  FALLS:  Has patient fallen in last 6 months? No  LIVING ENVIRONMENT: Lives with: lives with their family and lives with their spouse Lives in: House/apartment Stairs: Yes: Internal: 14 steps; on right going up Has following equipment at home: None  OCCUPATION: attorney  PLOF: Independent  PATIENT GOALS: Be able to find some relief before surgery is scheduled. Be able to walk and lift my grandchild out of high chair safely.   Next MD Visit:    OBJECTIVE:   DIAGNOSTIC FINDINGS:  XRs of the lumbar spine from 02/13/2023 showing spondylolisthesis at L4/5 that shifts about 1.5 mm between flexion and extension views. Disc height loss at L4/5. No other significant degenerative changes seen. No fracture or dislocation noted.   PATIENT SURVEYS:   Patient-Specific Activity Scoring Scheme  0 represents "unable to perform." 10 represents "able to perform at prior level. 0 1 2 3 4 5 6 7 8 9  10 (Date and Score)   Activity Eval  08/25/23    1. Walking for exercise/Target walking  2    2. Picking up grandchild 20# from high chair 5     3. Lowering grandchild to the crib for sleeping 0   4.stairs  5   5.    Score 3    Total score = sum of the activity scores/number of activities Minimum detectable change (90%CI) for average score = 2 points Minimum detectable change (90%CI) for single activity score = 3 points  SCREENING FOR RED FLAGS: Bowel or bladder incontinence: No Cauda equina syndrome: No  COGNITION: Overall cognitive status: WFL normal      SENSATION: Numbness/tingling down Rt LE and some in Left LE at times. Pt stating it can radiate down into her toes after prolonged standing.   POSTURE:  rounded shoulders, forward head, and decreased lumbar lordosis  PALPATION: TTP: bilateral QL   LUMBAR ROM:   AROM Eval 08/25/23  Flexion 58 mild pain  Extension 12  Right lateral flexion Finger tips to mid thigh  Left lateral flexion Finger tips to mid thigh  Right rotation 75% c pain  Left rotation 75% c pain   (Blank rows = not tested)  LOWER EXTREMITY ROM:      Right Eval 08/25/23 Left Eval 08/25/23  Hip flexion 96 100  Hip extension    Hip abduction    Hip adduction    Hip internal rotation    Hip external rotation    Knee flexion    Knee extension     (Blank rows = not tested)  LOWER EXTREMITY MMT:  MMT Right Eval 08/25/23 Left Eval 08/2323  Hip flexion    Hip extension    Hip abduction    Hip adduction    Hip internal rotation    Hip external rotation    Knee flexion 26.0 ppsi 28.0 ppsi  Knee extension 32.2 ppsi 37.4 ppsi  Ankle dorsiflexion    Ankle plantarflexion    Ankle inversion    Ankle eversion     (Blank rows = not tested)  LUMBAR SPECIAL TESTS:  Slump test: Negative,  noted hamstring tightness  FUNCTIONAL TESTS:  5 times sit to stand: 14.6 seconds with no UE support  GAIT: WFL:                                                                                                                                                                                                                   TODAY'S TREATMENT:                                                                                                         DATE:  08/25/23:   Therex:    HEP instruction/performance c cues for techniques, handout provided.  Trial set performed of each for comprehension and symptom assessment.  See below for exercise list Self Care:  Pt edu in sleeping positioning, using pillows for support   PATIENT EDUCATION:  Education details: HEP, POC Person educated: Patient Education method: Explanation, Demonstration, Verbal cues, and Handouts Education comprehension: verbalized understanding, returned demonstration, and verbal cues required  HOME EXERCISE PROGRAM: Access Code: [email or web address] URL: [email or web address] Date: 08/25/2023 Prepared by: Delon Lunger  Exercises - Standing Lumbar Extension at Wall - Forearms  - 2 x daily - 10 reps - 5 sec hold - Supine Lower Trunk Rotation  - 2 x daily - 3 reps - 20 seconds hold - Supine Piriformis Stretch with Foot on Ground  - 2 x daily - 3 reps - 20 seconds hold - Supine Bridge  - 2 x daily - 7 x weekly - 10 reps - 3 seconds hold - Supine Active Straight Leg  Raise  - 2 x daily - 7 x weekly - 10 reps  ASSESSMENT:  CLINICAL IMPRESSION: Patient is a 73 y.o. female who is currently still working. Pt arriving for PT evaluation with complaints of low back pain. Pt stating she is waiting for surgery to be scheduled. Pt presenting with mobility, strength and movement coordination deficits that impair their ability to perform usual daily and recreational functional activities without increase difficulty/symptoms at this time.   Patient to benefit from skilled PT services to address impairments and limitations to improve to previous level of function without restriction secondary to condition.   OBJECTIVE IMPAIRMENTS: decreased mobility, difficulty walking, decreased ROM, and decreased strength.   ACTIVITY LIMITATIONS: carrying, standing, squatting, and bed mobility  PARTICIPATION LIMITATIONS: community activity  PERSONAL FACTORS: see PMH above are also affecting patient's functional outcome.   REHAB POTENTIAL: Fair  to good, Pt waiting for lumbar surgery  CLINICAL DECISION MAKING: Stable/uncomplicated  EVALUATION COMPLEXITY: Low   GOALS: Goals reviewed with patient? Yes  SHORT TERM GOALS: (target date for Short term goals are 3 weeks 09/15/2023)  1. Patient will demonstrate independent use of home exercise program to maintain progress from in clinic treatments.  Goal status: New  LONG TERM GOALS: (target dates for all long term goals are 10 weeks  11/07/2023)   1. Patient will demonstrate/report pain at worst less than or equal to 2/10 to facilitate minimal limitation in daily activity secondary to pain symptoms.  Goal status: New   2. Patient will demonstrate independent use of home exercise program to facilitate ability to maintain/progress functional gains from skilled physical therapy services.  Goal status: New   3. Patient will demonstrate Patient specific functional scale avg > or = 5 to indicate reduced disability due to condition.   Goal status: New   4. Patient will demonstrate lumbar extension 100 % WFL s symptoms to facilitate upright standing, walking posture at PLOF s limitation.  Goal status: New   5.  Pt will be able to perform stair navigation using single hand rail with with reciprocal gait pattern.   Goal status: New   6.  Pt will improve her bilateral quad and hamstring strength by >/= 5 ppsi from baseline at evaluation.  Goal status: New     PLAN:  PT FREQUENCY:  1-2x/week  PT DURATION: 8 weeks  PLANNED INTERVENTIONS: Can include 02853- PT Re-evaluation, 97110-Therapeutic exercises, 97530- Therapeutic activity, [id number]- Neuromuscular re-education, 97535- Self Care, 97140- Manual therapy, [phone]- Gait training, [id number]- Orthotic Fit/training, [phone]- Canalith repositioning, [id number]- Aquatic Therapy, [phone]- Electrical stimulation (unattended), [id number] Physical performance testing, 97016- Vasopneumatic device, [id number]- Ultrasound, [id number]- Traction (mechanical), [id number]- Ionotophoresis 4mg /ml Dexamethasone ,  79439 - Needle insertion w/o injection 1 or 2 muscles, 20561 - Needle insertion w/o injection 3 or more muscles.    Patient/Family education, Balance training, Stair training, Taping, Dry Needling, Joint mobilization, Joint manipulation, Spinal manipulation, Spinal mobilization, Scar mobilization, Vestibular training, Visual/preceptual remediation/compensation, DME instructions, Cryotherapy, and Moist heat.  All performed as medically necessary.  All included unless contraindicated   PLAN FOR NEXT SESSION: Review HEP knowledge/results. Core strengthening Pt wants lumbar TRACTION     Delon JONELLE Lunger, PT, MPT 08/25/2023, 4:10 PM

## 2023-09-07 ENCOUNTER — Other Ambulatory Visit: Payer: Self-pay | Admitting: Orthopedic Surgery

## 2023-09-09 ENCOUNTER — Ambulatory Visit: Attending: Vascular Surgery | Admitting: Vascular Surgery

## 2023-09-09 ENCOUNTER — Encounter: Payer: Self-pay | Admitting: Vascular Surgery

## 2023-09-09 ENCOUNTER — Other Ambulatory Visit: Payer: Self-pay | Admitting: Orthopedic Surgery

## 2023-09-09 VITALS — BP 132/74 | HR 95 | Temp 97.9°F | Resp 18 | Ht 62.0 in | Wt 182.7 lb

## 2023-09-09 DIAGNOSIS — M48062 Spinal stenosis, lumbar region with neurogenic claudication: Secondary | ICD-10-CM | POA: Diagnosis not present

## 2023-09-09 NOTE — Progress Notes (Signed)
 Patient name: Rachel Burns MRN: [id number] DOB: 1951/03/04 Sex: female  REASON FOR CONSULT: Anterior spine exposure for L4-L5 ALIF  HPI: Rachel Burns is a 73 y.o. female, with history of diabetes, hypertension, fatty liver presents for evaluation of anterior spine exposure for L4-L5 ALIF.  She has chronic lower back pain.  She is under the care of Dr. Georgina.  Previously had a lumbar laminectomy with bilateral foraminotomies 11/25/2022.  Now with evidence of L4-L5 spondylolisthesis with bilateral foraminal stenosis.  Did have an MRI of her lumbar spine on 08/14/2023.  Previous abdominal surgery includes C-section and cholecystectomy.  Past Medical History:  Diagnosis Date   DDD (degenerative disc disease), lumbar    Diabetes mellitus without complication (HCC)    Fatty liver    GERD (gastroesophageal reflux disease)    Hypertension     Past Surgical History:  Procedure Laterality Date   ARM WOUND REPAIR / CLOSURE Right    arm was broken in 3 places   CESAREAN SECTION     CHOLECYSTECTOMY     DECOMPRESSIVE LUMBAR LAMINECTOMY LEVEL 1 N/A 11/25/2022   Procedure: L4-5 LUMBAR LAMINECTOMY WITH BILATERAL FORAMINOTOMIES;  Surgeon: Georgina Ozell LABOR, MD;  Location: [facility] OR;  Service: Orthopedics;  Laterality: N/A;   DILATION AND CURETTAGE OF UTERUS      History reviewed. No pertinent family history.  SOCIAL HISTORY: Social History   Socioeconomic History   Marital status: Married    Spouse name: Not on file   Number of children: 2   Years of education: Not on file   Highest education level: Not on file  Occupational History   Not on file  Tobacco Use   Smoking status: Never   Smokeless tobacco: Never  Vaping Use   Vaping status: Never Used  Substance and Sexual Activity   Alcohol use: Yes    Comment: maybe 1 drink twice a month   Drug use: Not Currently   Sexual activity: Not on file  Other Topics Concern   Not on file  Social History Narrative   Not on file    Social Drivers of Health   Financial Resource Strain: Not on file  Food Insecurity: Not on file  Transportation Needs: Not on file  Physical Activity: Not on file  Stress: Not on file  Social Connections: Not on file  Intimate Partner Violence: Not on file    Allergies  Allergen Reactions   Penicillins Rash    Current Outpatient Medications  Medication Sig Dispense Refill   azelastine (ASTELIN) 0.1 % nasal spray Place 2 sprays into both nostrils 2 (two) times daily as needed for rhinitis. Use in each nostril as directed     brimonidine  (ALPHAGAN  P) 0.1 % SOLN Apply 1 drop to eye 2 (two) times daily.     celecoxib  (CELEBREX ) 200 MG capsule Take 1 capsule (200 mg total) by mouth daily. 30 capsule 0   methocarbamol  (ROBAXIN ) 500 MG tablet TAKE 1 TABLET BY MOUTH 4 TIMES DAILY FOR 7 DAYS. 28 tablet 0   Multiple Vitamin (MULTIVITAMIN WITH MINERALS) TABS tablet Take 1 tablet by mouth daily.     Omega-3 Fatty Acids (FISH OIL PO) Take 1 capsule by mouth daily.     omeprazole (PRILOSEC) 40 MG capsule Take 40 mg by mouth daily.     pregabalin  (LYRICA ) 100 MG capsule TAKE 1 CAPSULE BY MOUTH TWICE A DAY 60 capsule 1   pregabalin  (LYRICA ) 50 MG capsule TAKE 1 CAPSULE BY MOUTH  2 TIMES DAILY. 60 capsule 0   Semaglutide, 1 MG/DOSE, (OZEMPIC, 1 MG/DOSE,) 4 MG/3ML SOPN Inject 1 mg into the skin once a week.     triamterene -hydrochlorothiazide  (MAXZIDE -25) 37.5-25 MG tablet Take 1 tablet by mouth daily.     VITAMIN A PO Take 1 tablet by mouth daily.     VYZULTA  0.024 % SOLN Place 1 drop into both eyes at bedtime.     No current facility-administered medications for this visit.    REVIEW OF SYSTEMS:  [X]  denotes positive finding, [ ]  denotes negative finding Cardiac  Comments:  Chest pain or chest pressure:    Shortness of breath upon exertion:    Short of breath when lying flat:    Irregular heart rhythm:        Vascular    Pain in calf, thigh, or hip brought on by ambulation:    Pain in  feet at night that wakes you up from your sleep:     Blood clot in your veins:    Leg swelling:         Pulmonary    Oxygen at home:    Productive cough:     Wheezing:         Neurologic    Sudden weakness in arms or legs:     Sudden numbness in arms or legs:     Sudden onset of difficulty speaking or slurred speech:    Temporary loss of vision in one eye:     Problems with dizziness:         Gastrointestinal    Blood in stool:     Vomited blood:         Genitourinary    Burning when urinating:     Blood in urine:        Psychiatric    Major depression:         Hematologic    Bleeding problems:    Problems with blood clotting too easily:        Skin    Rashes or ulcers:        Constitutional    Fever or chills:      PHYSICAL EXAM: Vitals:   09/09/23 1123  BP: 132/74  Pulse: 95  Resp: 18  Temp: 97.9 F (36.6 C)  TempSrc: Temporal  SpO2: 90%  Weight: 182 lb 11.2 oz (82.9 kg)  Height: 5' 2 (1.575 m)    GENERAL: The patient is a well-nourished female, in no acute distress. The vital signs are documented above. CARDIAC: There is a regular rate and rhythm.  VASCULAR:  Palpable bilateral femoral pulses Palpable bilateral DP pulses PULMONARY: No respiratory distress. ABDOMEN: Soft and non-tender. MUSCULOSKELETAL: There are no major deformities or cyanosis. NEUROLOGIC: No focal weakness or paresthesias are detected. SKIN: There are no ulcers or rashes noted. PSYCHIATRIC: The patient has a normal affect.  DATA:   MRI reviewed from 08/14/23    Assessment/Plan:  73 y.o. female, with history of diabetes, hypertension, fatty liver presents for evaluation of anterior spine exposure for L4-L5 ALIF.  She has chronic lower back pain.  She is under the care of Dr. Georgina.  Previously had a lumbar laminectomy with bilateral foraminotomies 11/25/2022.  Now with evidence of L4-L5 spondylolisthesis with bilateral foraminal stenosis.  I have reviewed her MRI and  discussed she would be a good candidate for anterior approach.  Discussed paramedian incision over the left rectus muscle.  I discussed entering into the rectus sheath and  mobilizing the left rectus muscle to the midline.  Discussed mobilizing peritoneum ane left ureter across midline.  Discussed mobilizing iliac artery and vein.  All this to get the disc space exposed from the front.  Discussed risk of injury to the above structures including vascular injury.  Look forward to assisting Dr. Georgina.   Lonni DOROTHA Gaskins, MD [facility] Office: [phone]

## 2023-09-12 ENCOUNTER — Ambulatory Visit (INDEPENDENT_AMBULATORY_CARE_PROVIDER_SITE_OTHER): Admitting: Physical Therapy

## 2023-09-12 ENCOUNTER — Encounter: Payer: Self-pay | Admitting: Physical Therapy

## 2023-09-12 DIAGNOSIS — M5459 Other low back pain: Secondary | ICD-10-CM

## 2023-09-12 DIAGNOSIS — M6281 Muscle weakness (generalized): Secondary | ICD-10-CM | POA: Diagnosis not present

## 2023-09-12 DIAGNOSIS — M25662 Stiffness of left knee, not elsewhere classified: Secondary | ICD-10-CM | POA: Diagnosis not present

## 2023-09-12 DIAGNOSIS — R2689 Other abnormalities of gait and mobility: Secondary | ICD-10-CM | POA: Diagnosis not present

## 2023-09-12 NOTE — Therapy (Addendum)
 OUTPATIENT PHYSICAL THERAPY TREATMENT Discharge   Patient Name: Rachel Burns MRN: [id number] DOB:1950-07-15, 73 y.o., female Today's Date: 09/12/2023  END OF SESSION:  PT End of Session - 09/12/23 1350     Visit Number 2    Number of Visits 16    Date for PT Re-Evaluation 10/24/23    PT Start Time 1350    PT Stop Time 1430    PT Time Calculation (min) 40 min    Activity Tolerance Patient tolerated treatment well;No increased pain    Behavior During Therapy WFL for tasks assessed/performed           Past Medical History:  Diagnosis Date   DDD (degenerative disc disease), lumbar    Diabetes mellitus without complication (HCC)    Fatty liver    GERD (gastroesophageal reflux disease)    Hypertension    Past Surgical History:  Procedure Laterality Date   ARM WOUND REPAIR / CLOSURE Right    arm was broken in 3 places   CESAREAN SECTION     CHOLECYSTECTOMY     DECOMPRESSIVE LUMBAR LAMINECTOMY LEVEL 1 N/A 11/25/2022   Procedure: L4-5 LUMBAR LAMINECTOMY WITH BILATERAL FORAMINOTOMIES;  Surgeon: Georgina Ozell LABOR, MD;  Location: [facility] OR;  Service: Orthopedics;  Laterality: N/A;   DILATION AND CURETTAGE OF UTERUS     Patient Active Problem List   Diagnosis Date Noted   Lumbar stenosis with neurogenic claudication 11/25/2022   Spinal stenosis of lumbar region with neurogenic claudication 01/17/2016   Lumbar radiculopathy 01/17/2016    PCP: Gerome Brunet, DO   REFERRING PROVIDER: Georgina Ozell LABOR, MD   REFERRING DIAG: M54.16 (ICD-10-CM) - Radiculopathy, lumbar region   Rationale for Evaluation and Treatment: Rehabilitation  THERAPY DIAG:  Other low back pain  Muscle weakness (generalized)  Other abnormalities of gait and mobility  Stiffness of left knee, not elsewhere classified  ONSET DATE: on going since last year, traction has helped in the past  SUBJECTIVE:  SUBJECTIVE STATEMENT: Pt states she is interested in traction. Has a fusion planned on October 24, 2023. Pt states she has done the exercises and reports no issues.   From eval: Patient arriving to therapy for evaluation of back pain and bilateral lower extremity pain. The pain is worse in her right leg. She feels the pain in the lateral aspect of her thigh and it goes into the medial aspect of the leg. Pain is experienced daily.  Pt unable to pin point specific event. Pain is worse with activity but it is still present with rest. Pt reporting unable to walk for more than a few minutes. Pt reporting worse pain is 8/10.  Pt stating traction has helped in the past and wishes to try traction again.   PERTINENT HISTORY:  See PMH, DDD, HTN S/P L4-5 Lumbar laminectomy 11/25/22   PAIN:  NPRS scale: 8/10 at worse, 2-3/10 at present Pain location: low back, QL, lumbar paraspinals Pain description: achy, radiating down Rt LE Aggravating factors: lifting, bending, walking prolonged   PRECAUTIONS: None  WEIGHT BEARING RESTRICTIONS: No  FALLS:  Has patient fallen in last 6 months? No  LIVING ENVIRONMENT: Lives with: lives with their family and lives with their spouse Lives in: House/apartment Stairs: Yes: Internal: 14 steps; on right going up Has following equipment at home: None  OCCUPATION: attorney  PLOF: Independent  PATIENT GOALS: Be able to find some relief before surgery is scheduled. Be able to walk and lift my grandchild out of high chair safely.   Next MD Visit:    OBJECTIVE:   DIAGNOSTIC FINDINGS:  XRs of the lumbar spine from 02/13/2023 showing spondylolisthesis at L4/5 that shifts about 1.5 mm between flexion  and extension views. Disc height loss at L4/5. No other significant degenerative changes seen. No fracture or dislocation noted.   PATIENT SURVEYS:  Patient-Specific Activity Scoring Scheme  0 represents "unable to perform." 10 represents "able to perform at prior level. 0 1 2 3 4 5 6 7 8 9  10 (Date and Score)   Activity Eval  08/25/23    1. Walking for exercise/Target walking  2    2. Picking up grandchild 20# from high chair 5     3. Lowering grandchild to the crib for sleeping 0   4.stairs  5   5.    Score 3    Total score = sum of the activity scores/number of activities Minimum detectable change (90%CI) for average score = 2 points Minimum detectable change (90%CI) for single activity score = 3 points  SCREENING FOR RED FLAGS: Bowel or bladder incontinence: No Cauda equina syndrome: No  SENSATION: Numbness/tingling down Rt LE and some in Left LE at times. Pt stating it can radiate down into her toes after prolonged standing.   POSTURE:  rounded shoulders, forward head, and decreased lumbar lordosis  PALPATION: TTP: bilateral QL   LUMBAR ROM:   AROM Eval 08/25/23  Flexion 58 mild pain  Extension 12  Right lateral flexion Finger tips to mid thigh  Left lateral flexion Finger tips to mid thigh  Right rotation 75% c pain  Left rotation 75% c pain   (Blank rows = not tested)  LOWER EXTREMITY ROM:      Right Eval 08/25/23 Left Eval 08/25/23  Hip flexion 96 100  Hip extension    Hip abduction    Hip adduction    Hip internal rotation    Hip external rotation    Knee flexion  Knee extension     (Blank rows = not tested)  LOWER EXTREMITY MMT:    MMT Right Eval 08/25/23 Left Eval 08/2323  Hip flexion    Hip extension    Hip abduction    Hip adduction    Hip internal rotation    Hip external rotation    Knee flexion 26.0 ppsi 28.0 ppsi  Knee extension 32.2 ppsi 37.4 ppsi  Ankle dorsiflexion    Ankle plantarflexion    Ankle inversion     Ankle eversion     (Blank rows = not tested)  LUMBAR SPECIAL TESTS:  Slump test: Negative, noted hamstring tightness  FUNCTIONAL TESTS:  5 times sit to stand: 14.6 seconds with no UE support  GAIT: WFL:                                                                                                                                                                                                                  TODAY'S TREATMENT:                                                                                                         DATE:  09/12/23:   Therex: Nustep L6 x 8 min UEs/LEs Piriformis stretch x 30 Legs 90/90 ab set 3x10  Mechanical Traction: Lumbar 80 lbs max, 25 lbs min, 60/20 hold/rest x 20 min     TODAY'S TREATMENT:                                                                                                         DATE:  08/25/23:   Therex:    HEP instruction/performance  c cues for techniques, handout provided.  Trial set performed of each for comprehension and symptom assessment.  See below for exercise list Self Care:  Pt edu in sleeping positioning, using pillows for support   PATIENT EDUCATION:  Education details: Traction education/set up, HEP review Person educated: Patient Education method: Explanation, Demonstration, Verbal cues, and Handouts Education comprehension: verbalized understanding, returned demonstration, and verbal cues required  HOME EXERCISE PROGRAM: Access Code: [email or web address] URL: [email or web address] Date: 08/25/2023 Prepared by: Delon Lunger  Exercises - Standing Lumbar Extension at Wall - Forearms  - 2 x daily - 10 reps - 5 sec hold - Supine Lower Trunk Rotation  - 2 x daily - 3 reps - 20 seconds hold - Supine Piriformis Stretch with Foot on Ground  - 2 x daily - 3 reps - 20 seconds hold - Supine Bridge  - 2 x daily - 7 x weekly - 10 reps - 3 seconds hold - Supine Active Straight Leg Raise  - 2 x daily - 7 x weekly  - 10 reps  ASSESSMENT:  CLINICAL IMPRESSION: Lumbar traction performed per pt's request. Continued to work on gross LE strengthening to keep pt conditioned until her surgery. Worked on core strengthening.   From eval: Patient is a 73 y.o. female who is currently still working. Pt arriving for PT evaluation with complaints of low back pain. Pt stating she is waiting for surgery to be scheduled. Pt presenting with mobility, strength and movement coordination deficits that impair their ability to perform usual daily and recreational functional activities without increase difficulty/symptoms at this time.  Patient to benefit from skilled PT services to address impairments and limitations to improve to previous level of function without restriction secondary to condition.   OBJECTIVE IMPAIRMENTS: decreased mobility, difficulty walking, decreased ROM, and decreased strength.    GOALS: Goals reviewed with patient? Yes  SHORT TERM GOALS: (target date for Short term goals are 3 weeks 09/15/2023)  1. Patient will demonstrate independent use of home exercise program to maintain progress from in clinic treatments.  Goal status: New  LONG TERM GOALS: (target dates for all long term goals are 10 weeks  11/07/2023)   1. Patient will demonstrate/report pain at worst less than or equal to 2/10 to facilitate minimal limitation in daily activity secondary to pain symptoms.  Goal status: New   2. Patient will demonstrate independent use of home exercise program to facilitate ability to maintain/progress functional gains from skilled physical therapy services.  Goal status: New   3. Patient will demonstrate Patient specific functional scale avg > or = 5 to indicate reduced disability due to condition.   Goal status: New   4. Patient will demonstrate lumbar extension 100 % WFL s symptoms to facilitate upright standing, walking posture at PLOF s limitation.  Goal status: New   5.  Pt will be able to  perform stair navigation using single hand rail with with reciprocal gait pattern.   Goal status: New   6.  Pt will improve her bilateral quad and hamstring strength by >/= 5 ppsi from baseline at evaluation.  Goal status: New     PLAN:  PT FREQUENCY: 1-2x/week  PT DURATION: 8 weeks  PLANNED INTERVENTIONS: Can include 02853- PT Re-evaluation, 97110-Therapeutic exercises, 97530- Therapeutic activity, [id number]- Neuromuscular re-education, 97535- Self Care, 97140- Manual therapy, [id number]- Gait training, [phone]- Orthotic Fit/training, [phone]- Canalith repositioning, [id number]- Aquatic Therapy, H9716- Electrical stimulation (unattended), [id number] Physical performance testing, 97016- Vasopneumatic device, [id number]- Ultrasound, [id number]- Traction (mechanical), [id number]-  Ionotophoresis 4mg /ml Dexamethasone ,  79439 - Needle insertion w/o injection 1 or 2 muscles, 20561 - Needle insertion w/o injection 3 or more muscles.    Patient/Family education, Balance training, Stair training, Taping, Dry Needling, Joint mobilization, Joint manipulation, Spinal manipulation, Spinal mobilization, Scar mobilization, Vestibular training, Visual/preceptual remediation/compensation, DME instructions, Cryotherapy, and Moist heat.  All performed as medically necessary.  All included unless contraindicated   PLAN FOR NEXT SESSION: Review HEP knowledge/results. Core strengthening Pt wants lumbar TRACTION     Christa Fasig April Ma L Younique Casad, PT, DPT 09/12/2023, 1:50 PM   PHYSICAL THERAPY DISCHARGE SUMMARY  Visits from Start of Care: 2  Current functional level related to goals / functional outcomes: See above   Remaining deficits: See above   Education / Equipment: HEP   Patient agrees to discharge. Patient goals were not met. Patient is being discharged due to pt discontinued treatment due to no mechanical available due to machine needed work. SABRA

## 2023-09-16 ENCOUNTER — Encounter: Admitting: Physical Therapy

## 2023-09-16 ENCOUNTER — Telehealth: Payer: Self-pay | Admitting: Physical Therapy

## 2023-09-16 NOTE — Telephone Encounter (Signed)
 I called pt to follow up after she missed her 2:30 PT appointment. Pt stating she had her days mixed up and thought her appointment was tomorrow. Pt was offered an open appointment tomorrow at 3:15 and she was unable to attend. Pt was reminded of her next appointment on 09/23/23 at 2:30. Delon Lunger, PT, MPT 09/16/23 3:11 PM

## 2023-09-23 ENCOUNTER — Ambulatory Visit (INDEPENDENT_AMBULATORY_CARE_PROVIDER_SITE_OTHER): Admitting: Physical Therapy

## 2023-09-23 ENCOUNTER — Other Ambulatory Visit: Payer: Self-pay

## 2023-09-23 ENCOUNTER — Encounter: Payer: Self-pay | Admitting: Physical Therapy

## 2023-09-23 DIAGNOSIS — M5459 Other low back pain: Secondary | ICD-10-CM

## 2023-09-23 NOTE — Therapy (Signed)
 Pt arrived to therapy today for her visit wishing to continue with lumbar traction. Pt was informed our traction machine was out of service at the present time. Pt was offered other treatment options but pt wishing to hold her visit until next week hoping the machine will be fixed by then.  Delon Lunger, PT, MPT 09/23/23 3:03 PM

## 2023-09-30 ENCOUNTER — Encounter: Admitting: Physical Therapy

## 2023-10-07 ENCOUNTER — Encounter (INDEPENDENT_AMBULATORY_CARE_PROVIDER_SITE_OTHER): Admitting: Physical Therapy

## 2023-10-13 ENCOUNTER — Encounter: Admitting: Physical Therapy

## 2023-10-20 NOTE — Progress Notes (Addendum)
 Surgical Instructions   Your procedure is scheduled on  Friday October 24, 2023. Report to [facility] Main Entrance A at 5:30 A.M., then check in with the Admitting office. Any questions or running late day of surgery: call [phone]  Questions prior to your surgery date: call [phone], Monday-Friday, 8am-4pm. If you experience any cold or flu symptoms such as cough, fever, chills, shortness of breath, etc. between now and your scheduled surgery, please notify us  at the above number.     Remember:  Do not eat after midnight the night before your surgery  You may drink clear liquids until 4:30 the morning of your surgery.   Clear liquids allowed are: Water, Non-Citrus Juices (without pulp), Carbonated Beverages, Clear Tea (no milk, honey, etc.), Black Coffee Only (NO MILK, CREAM OR POWDERED CREAMER of any kind), and Gatorade.  Patient Instructions  The day of surgery (if you have diabetes): Drink ONE (1) 12 oz G2 given to you in your pre admission testing appointment by 4:30 the morning of surgery. Drink in one sitting. Do not sip.  This drink was given to you during your hospital  pre-op appointment visit.  Nothing else to drink after completing the  12 oz bottle of G2.         If you have questions, please contact your surgeon's office.    Take these medicines the morning of surgery with A SIP OF WATER brimonidine  (ALPHAGAN  P)  omeprazole (PRILOSEC)  pregabalin  (LYRICA )   May take these medicines IF NEEDED: azelastine (ASTELIN)  pregabalin  (LYRICA )   One week prior to surgery, STOP taking any Aspirin (unless otherwise instructed by your surgeon) Aleve, Naproxen, Ibuprofen, Motrin, Advil, Goody's, BC's, all herbal medications, fish oil, and non-prescription vitamins. THIS INCLUDES YOUR Omega-3 Fatty Acids (FISH OIL PO)               WHAT DO I DO ABOUT MY DIABETES MEDICATION?       LAST DOSE of empagliflozin (JARDIANCE) 10-20-23  Do not take oral diabetes medicines  empagliflozin (JARDIANCE)  (pills) the morning of surgery.  The day of surgery, do not take other diabetes injectables, including Byetta (exenatide), Bydureon (exenatide ER), Victoza (liraglutide), or Trulicity (dulaglutide).  If your CBG is greater than 220 mg/dL, you may take  of your sliding scale (correction) dose of insulin .   HOW TO MANAGE YOUR DIABETES BEFORE AND AFTER SURGERY  Why is it important to control my blood sugar before and after surgery? Improving blood sugar levels before and after surgery helps healing and can limit problems. A way of improving blood sugar control is eating a healthy diet by:  Eating less sugar and carbohydrates  Increasing activity/exercise  Talking with your doctor about reaching your blood sugar goals High blood sugars (greater than 180 mg/dL) can raise your risk of infections and slow your recovery, so you will need to focus on controlling your diabetes during the weeks before surgery. Make sure that the doctor who takes care of your diabetes knows about your planned surgery including the date and location.  How do I manage my blood sugar before surgery? Check your blood sugar at least 4 times a day, starting 2 days before surgery, to make sure that the level is not too high or low.  Check your blood sugar the morning of your surgery when you wake up and every 2 hours until you get to the Short Stay unit.  If your blood sugar is less than 70 mg/dL, you will  need to treat for low blood sugar: Do not take insulin . Treat a low blood sugar (less than 70 mg/dL) with  cup of clear juice (cranberry or apple), 4 glucose tablets, OR glucose gel. Recheck blood sugar in 15 minutes after treatment (to make sure it is greater than 70 mg/dL). If your blood sugar is not greater than 70 mg/dL on recheck, call [phone] for further instructions. Report your blood sugar to the short stay nurse when you get to Short Stay.  If you are admitted to the hospital  after surgery: Your blood sugar will be checked by the staff and you will probably be given insulin  after surgery (instead of oral diabetes medicines) to make sure you have good blood sugar levels. The goal for blood sugar control after surgery is 80-180 mg/dL.        Do NOT Smoke (Tobacco/Vaping) for 24 hours prior to your procedure.  If you use a CPAP at night, you may bring your mask/headgear for your overnight stay.   You will be asked to remove any contacts, glasses, piercing's, hearing aid's, dentures/partials prior to surgery. Please bring cases for these items if needed.    Patients discharged the day of surgery will not be allowed to drive home, and someone needs to stay with them for 24 hours.  SURGICAL WAITING ROOM VISITATION Patients may have no more than 2 support people in the waiting area - these visitors may rotate.   Pre-op nurse will coordinate an appropriate time for 1 ADULT support person, who may not rotate, to accompany patient in pre-op.  Children under the age of 63 must have an adult with them who is not the patient and must remain in the main waiting area with an adult.  If the patient needs to stay at the hospital during part of their recovery, the visitor guidelines for inpatient rooms apply.  Please refer to the [facility] website for the visitor guidelines for any additional information.   If you received a COVID test during your pre-op visit  it is requested that you wear a mask when out in public, stay away from anyone that may not be feeling well and notify your surgeon if you develop symptoms. If you have been in contact with anyone that has tested positive in the last 10 days please notify you surgeon.      Pre-operative 5 CHG Bathing Instructions   You can play a key role in reducing the risk of infection after surgery. Your skin needs to be as free of germs as possible. You can reduce the number of germs on your skin by washing with CHG  (chlorhexidine  gluconate) soap before surgery. CHG is an antiseptic soap that kills germs and continues to kill germs even after washing.   DO NOT use if you have an allergy to chlorhexidine /CHG or antibacterial soaps. If your skin becomes reddened or irritated, stop using the CHG and notify one of our RNs at [phone].   Please shower with the CHG soap starting 4 days before surgery using the following schedule:     Please keep in mind the following:  DO NOT shave, including legs and underarms, starting the day of your first shower.   You may shave your face at any point before/day of surgery.  Place clean sheets on your bed the day you start using CHG soap. Use a clean washcloth (not used since being washed) for each shower. DO NOT sleep with pets once you start using the  CHG.   CHG Shower Instructions:  Wash your face and private area with normal soap. If you choose to wash your hair, wash first with your normal shampoo.  After you use shampoo/soap, rinse your hair and body thoroughly to remove shampoo/soap residue.  Turn the water OFF and apply about 3 tablespoons (45 ml) of CHG soap to a CLEAN washcloth.  Apply CHG soap ONLY FROM YOUR NECK DOWN TO YOUR TOES (washing for 3-5 minutes)  DO NOT use CHG soap on face, private areas, open wounds, or sores.  Pay special attention to the area where your surgery is being performed.  If you are having back surgery, having someone wash your back for you may be helpful. Wait 2 minutes after CHG soap is applied, then you may rinse off the CHG soap.  Pat dry with a clean towel  Put on clean clothes/pajamas   If you choose to wear lotion, please use ONLY the CHG-compatible lotions that are listed below.  Additional instructions for the day of surgery: DO NOT APPLY any lotions, deodorants, cologne, or perfumes.   Do not bring valuables to the hospital. [facility] is not responsible for any belongings/valuables. Do not wear nail polish, gel  polish, artificial nails, or any other type of covering on natural nails (fingers and toes) Do not wear jewelry or makeup Put on clean/comfortable clothes.  Please brush your teeth.  Ask your nurse before applying any prescription medications to the skin.     CHG Compatible Lotions   Aveeno Moisturizing lotion  Cetaphil Moisturizing Cream  Cetaphil Moisturizing Lotion  Clairol Herbal Essence Moisturizing Lotion, Dry Skin  Clairol Herbal Essence Moisturizing Lotion, Extra Dry Skin  Clairol Herbal Essence Moisturizing Lotion, Normal Skin  Curel Age Defying Therapeutic Moisturizing Lotion with Alpha Hydroxy  Curel Extreme Care Body Lotion  Curel Soothing Hands Moisturizing Hand Lotion  Curel Therapeutic Moisturizing Cream, Fragrance-Free  Curel Therapeutic Moisturizing Lotion, Fragrance-Free  Curel Therapeutic Moisturizing Lotion, Original Formula  Eucerin Daily Replenishing Lotion  Eucerin Dry Skin Therapy Plus Alpha Hydroxy Crme  Eucerin Dry Skin Therapy Plus Alpha Hydroxy Lotion  Eucerin Original Crme  Eucerin Original Lotion  Eucerin Plus Crme Eucerin Plus Lotion  Eucerin TriLipid Replenishing Lotion  Keri Anti-Bacterial Hand Lotion  Keri Deep Conditioning Original Lotion Dry Skin Formula Softly Scented  Keri Deep Conditioning Original Lotion, Fragrance Free Sensitive Skin Formula  Keri Lotion Fast Absorbing Fragrance Free Sensitive Skin Formula  Keri Lotion Fast Absorbing Softly Scented Dry Skin Formula  Keri Original Lotion  Keri Skin Renewal Lotion Keri Silky Smooth Lotion  Keri Silky Smooth Sensitive Skin Lotion  Nivea Body Creamy Conditioning Oil  Nivea Body Extra Enriched Lotion  Nivea Body Original Lotion  Nivea Body Sheer Moisturizing Lotion Nivea Crme  Nivea Skin Firming Lotion  NutraDerm 30 Skin Lotion  NutraDerm Skin Lotion  NutraDerm Therapeutic Skin Cream  NutraDerm Therapeutic Skin Lotion  ProShield Protective Hand Cream  Provon moisturizing  lotion  Please read over the following fact sheets that you were given.

## 2023-10-21 ENCOUNTER — Encounter (HOSPITAL_COMMUNITY): Payer: Self-pay

## 2023-10-21 ENCOUNTER — Other Ambulatory Visit: Payer: Self-pay

## 2023-10-21 ENCOUNTER — Encounter (HOSPITAL_COMMUNITY)
Admission: RE | Admit: 2023-10-21 | Discharge: 2023-10-21 | Disposition: A | Discharge status: Home / Self Care | Source: Ambulatory Visit | Attending: Orthopedic Surgery | Admitting: Orthopedic Surgery

## 2023-10-21 ENCOUNTER — Encounter: Admitting: Vascular Surgery

## 2023-10-21 VITALS — BP 143/72 | HR 95 | Temp 98.2°F | Resp 18 | Ht 61.0 in | Wt 181.0 lb

## 2023-10-21 DIAGNOSIS — M5416 Radiculopathy, lumbar region: Secondary | ICD-10-CM | POA: Insufficient documentation

## 2023-10-21 DIAGNOSIS — Z01812 Encounter for preprocedural laboratory examination: Secondary | ICD-10-CM | POA: Insufficient documentation

## 2023-10-21 DIAGNOSIS — K219 Gastro-esophageal reflux disease without esophagitis: Secondary | ICD-10-CM | POA: Insufficient documentation

## 2023-10-21 DIAGNOSIS — Z9049 Acquired absence of other specified parts of digestive tract: Secondary | ICD-10-CM | POA: Diagnosis not present

## 2023-10-21 DIAGNOSIS — I10 Essential (primary) hypertension: Secondary | ICD-10-CM | POA: Insufficient documentation

## 2023-10-21 DIAGNOSIS — Z01818 Encounter for other preprocedural examination: Secondary | ICD-10-CM

## 2023-10-21 DIAGNOSIS — Z7984 Long term (current) use of oral hypoglycemic drugs: Secondary | ICD-10-CM | POA: Insufficient documentation

## 2023-10-21 DIAGNOSIS — Z9889 Other specified postprocedural states: Secondary | ICD-10-CM | POA: Insufficient documentation

## 2023-10-21 DIAGNOSIS — E119 Type 2 diabetes mellitus without complications: Secondary | ICD-10-CM | POA: Diagnosis not present

## 2023-10-21 HISTORY — DX: Unspecified osteoarthritis, unspecified site: M19.90

## 2023-10-21 LAB — SURGICAL PCR SCREEN
MRSA, PCR: NEGATIVE
Staphylococcus aureus: NEGATIVE

## 2023-10-21 LAB — CBC
HCT: 50 % — ABNORMAL HIGH (ref 36.0–46.0)
Hemoglobin: 16.4 g/dL — ABNORMAL HIGH (ref 12.0–15.0)
MCH: 31.7 pg (ref 26.0–34.0)
MCHC: 32.8 g/dL (ref 30.0–36.0)
MCV: 96.5 fL (ref 80.0–100.0)
Platelets: 319 K/uL (ref 150–400)
RBC: 5.18 MIL/uL — ABNORMAL HIGH (ref 3.87–5.11)
RDW: 12.6 % (ref 11.5–15.5)
WBC: 6.6 K/uL (ref 4.0–10.5)
nRBC: 0 % (ref 0.0–0.2)

## 2023-10-21 LAB — COMPREHENSIVE METABOLIC PANEL WITH GFR
ALT: 35 U/L (ref 0–44)
AST: 33 U/L (ref 15–41)
Albumin: 3.9 g/dL (ref 3.5–5.0)
Alkaline Phosphatase: 67 U/L (ref 38–126)
Anion gap: 13 (ref 5–15)
BUN: 18 mg/dL (ref 8–23)
CO2: 25 mmol/L (ref 22–32)
Calcium: 9.4 mg/dL (ref 8.9–10.3)
Chloride: 95 mmol/L — ABNORMAL LOW (ref 98–111)
Creatinine, Ser: 0.92 mg/dL (ref 0.44–1.00)
GFR, Estimated: >60 mL/min (ref >60)
Glucose, Bld: 345 mg/dL — ABNORMAL HIGH (ref 70–99)
Potassium: 3.6 mmol/L (ref 3.5–5.1)
Sodium: 133 mmol/L — ABNORMAL LOW (ref 135–145)
Total Bilirubin: 0.6 mg/dL (ref 0.0–1.2)
Total Protein: 7.9 g/dL (ref 6.5–8.1)

## 2023-10-21 LAB — HEMOGLOBIN A1C
Hgb A1c MFr Bld: 8.3 % — ABNORMAL HIGH (ref 4.8–5.6)
Mean Plasma Glucose: 191.51 mg/dL

## 2023-10-21 LAB — GLUCOSE, CAPILLARY: Glucose-Capillary: 309 mg/dL — ABNORMAL HIGH (ref 70–99)

## 2023-10-21 LAB — TYPE AND SCREEN
ABO/RH(D): A POS
Antibody Screen: NEGATIVE

## 2023-10-21 NOTE — Progress Notes (Signed)
 PCP - Gerome Brunet Cardiologist - denies  PPM/ICD - denies Device Orders - n/a Rep Notified - n/a  Chest x-ray -  EKG - 11/18/2022 Stress Test - denies ECHO - denies Cardiac Cath - denies  Sleep Study - denies CPAP - n/a  Fasting Blood Sugar - per pt 120ies at PCP's office. Pt does not check blood sugar at home. CBG 309 at PAT. She just ate an english muffin with some peanut butter and a banana before coming in for her appt. She was told to stop Mounjaro a month ago and PCP switched her to [place] in the middle of July; there is no short acting insulin . Per pt her A1C at PCP office was 7.7 in the beginning of this month.  Rachel Burns, Rachel Burns is aware.  Will repeat A1C.     Last dose of GLP1 agonist-  n/a GLP1 instructions: n/a  Blood Thinner Instructions: n/a Aspirin Instructions: n/a  ERAS Protcol -yes, till 0430 PRE-SURGERY Ensure or G2- G2  COVID TEST- n/a   Anesthesia review: yes, hx of DM2.  Patient denies shortness of breath, fever, cough and chest pain at PAT appointment   All instructions explained to the patient, with a verbal understanding of the material. Patient agrees to go over the instructions while at home for a better understanding. Patient also instructed to self quarantine after being tested for COVID-19. The opportunity to ask questions was provided.

## 2023-10-22 LAB — COLOGUARD

## 2023-10-22 NOTE — Progress Notes (Signed)
 Anesthesia Chart Review:  Case: [id number] Date/Time: 10/24/23 0715   Procedures:      ANTERIOR LATERAL LUMBAR FUSION 1 LEVEL     POSTERIOR LUMBAR FUSION 1 LEVEL - L4-5 ANTERIOR LUMBAR INTERBODY FUSION / POSTERIOR INSTRUMENTED SPINAL FUSION     ABDOMINAL EXPOSURE   Anesthesia type: General   Diagnosis: Lumbar radiculopathy [M54.16]   Pre-op diagnosis: LUMBAR RADICULOPATHY   [facility]: [facility] ROOM 18 / [facility]   Surgeons: Georgina Ozell LABOR, MD; Gretta Lonni PARAS, MD       DISCUSSION: Patient is a 73 year old female scheduled for the above procedure.  History includes never smoker, HTN, DM2, GERD, cholecystectomy, spinal surgery (L4-5 laminectomy/foraminotomy 11/25/2022).  She had PCP visit with Dr. Gerome on 10/09/2023 for follow-up DM. A1c 7.7%. She suggested that she had not been on Jardiance very long. (Was reported started about a month ago after Mounjaro was stooped.). Currently on Jardiance 25 mg daily for DM. No recurrent yeast infection since starting. Dr. Gerome noted plans for back surgery. Will consider starting/restarting Mounjaro at a later date. She is not on statin therapy due to myalgias.   Patient's CBG on arrival to PAT was 309. She had eaten just prior to visit. She does not check home CBGs (only checked at PCP office). She thought A1c would be better since she had been on Jardiance longer, but up to 8.3% on 10/22/2023. I did route the result to Dr. Gerome and also communicated with Dr. Georgina. He will discuss with patient and review potential associated risks of infection or would healing issues with elevated A1c. Also advised RN staff to discuss DM diet compliance leading up to surgery and to seek evaluation if symptoms of hyper- or hypoglycemia. Last dose Jardiance 10/20/2023.   She will get a CBG on arrival. Anesthesia team to evaluate on the day of surgery.    VS: BP (!) 143/72   Pulse 95   Temp 36.8 C   Resp 18   Ht 5' 1 (1.549 m)   Wt 82.1 kg   SpO2 97%   BMI 34.20  kg/m    PROVIDERS: Gerome Brunet, DO is PCP    LABS: See DISCUSSION. (all labs ordered are listed, but only abnormal results are displayed)  Labs Reviewed  GLUCOSE, CAPILLARY - Abnormal; Notable for the following components:      Result Value   Glucose-Capillary 309 (*)    All other components within normal limits  CBC - Abnormal; Notable for the following components:   RBC 5.18 (*)    Hemoglobin 16.4 (*)    HCT 50.0 (*)    All other components within normal limits  HEMOGLOBIN A1C - Abnormal; Notable for the following components:   Hgb A1c MFr Bld 8.3 (*)    All other components within normal limits  COMPREHENSIVE METABOLIC PANEL WITH GFR - Abnormal; Notable for the following components:   Sodium 133 (*)    Chloride 95 (*)    Glucose, Bld 345 (*)    All other components within normal limits  SURGICAL PCR SCREEN  TYPE AND SCREEN    IMAGES: MRI L-spine 08/14/2023: IMPRESSION: 1. Interval bilateral laminectomies at L4-5 with adequate posterior decompression of the spinal canal. There is a small, nonspecific fluid collection within the laminectomy bed, likely a postoperative seroma. 2. Underlying advanced facet hypertrophy at L4-5 contributing to grade 1 anterolisthesis and mild foraminal narrowing bilaterally. 3. Stable mild multifactorial spinal stenosis at L3-4. 4. Stable mild left foraminal narrowing  at L5-S1. 5. No new findings demonstrated.     EKG: 11/18/2022: NSR   CV: N/A  Past Medical History:  Diagnosis Date   Arthritis    DDD (degenerative disc disease), lumbar    Diabetes mellitus without complication (HCC)    Fatty liver    GERD (gastroesophageal reflux disease)    Hypertension     Past Surgical History:  Procedure Laterality Date   ARM WOUND REPAIR / CLOSURE Right    arm was broken in 3 places   CESAREAN SECTION     CHOLECYSTECTOMY     DECOMPRESSIVE LUMBAR LAMINECTOMY LEVEL 1 N/A 11/25/2022   Procedure: L4-5 LUMBAR LAMINECTOMY WITH  BILATERAL FORAMINOTOMIES;  Surgeon: Georgina Ozell LABOR, MD;  Location: [facility] OR;  Service: Orthopedics;  Laterality: N/A;   DILATION AND CURETTAGE OF UTERUS      MEDICATIONS:  azelastine (ASTELIN) 0.1 % nasal spray   brimonidine  (ALPHAGAN  P) 0.1 % SOLN   celecoxib  (CELEBREX ) 200 MG capsule   cholecalciferol (VITAMIN D3) 25 MCG (1000 UNIT) tablet   empagliflozin (JARDIANCE) 25 MG TABS tablet   methocarbamol  (ROBAXIN ) 500 MG tablet   Multiple Vitamin (MULTIVITAMIN WITH MINERALS) TABS tablet   Omega-3 Fatty Acids (FISH OIL PO)   omeprazole (PRILOSEC) 40 MG capsule   pregabalin  (LYRICA ) 100 MG capsule   pregabalin  (LYRICA ) 50 MG capsule   triamterene -hydrochlorothiazide  (MAXZIDE -25) 37.5-25 MG tablet   VYZULTA  0.024 % SOLN   No current facility-administered medications for this encounter.    Isaiah Ruder, PA-C Surgical Short Stay/Anesthesiology [facility] Phone [phone] [facility] Phone [phone] 10/22/2023 12:44 PM

## 2023-10-22 NOTE — Anesthesia Preprocedure Evaluation (Signed)
 Anesthesia Evaluation    Airway        Dental   Pulmonary           Cardiovascular hypertension,      Neuro/Psych    GI/Hepatic   Endo/Other  diabetes    Renal/GU      Musculoskeletal   Abdominal   Peds  Hematology   Anesthesia Other Findings   Reproductive/Obstetrics                              Anesthesia Physical Anesthesia Plan  ASA:   Anesthesia Plan:    Post-op Pain Management:    Induction:   PONV Risk Score and Plan:   Airway Management Planned:   Additional Equipment:   Intra-op Plan:   Post-operative Plan:   Informed Consent:   Plan Discussed with:   Anesthesia Plan Comments: (PAT note written 10/22/2023 by Addisson Frate, PA-C.  )        Anesthesia Quick Evaluation

## 2023-10-24 ENCOUNTER — Ambulatory Visit (HOSPITAL_COMMUNITY): Admission: RE | Admit: 2023-10-24 | Source: Home / Self Care | Admitting: Orthopedic Surgery

## 2023-10-24 ENCOUNTER — Encounter (HOSPITAL_COMMUNITY): Payer: Self-pay | Admitting: Vascular Surgery

## 2023-10-24 ENCOUNTER — Encounter (HOSPITAL_COMMUNITY): Admission: RE | Payer: Self-pay | Source: Home / Self Care

## 2023-10-24 DIAGNOSIS — Z01818 Encounter for other preprocedural examination: Secondary | ICD-10-CM

## 2023-10-24 SURGERY — ANTERIOR LATERAL LUMBAR FUSION 1 LEVEL
Anesthesia: General

## 2023-10-29 ENCOUNTER — Telehealth: Payer: Self-pay | Admitting: Orthopedic Surgery

## 2023-10-29 NOTE — Telephone Encounter (Signed)
 Pt states that her A1c is elevated at 8.3 and her Blood glucose is out of wack. She wants to know if she should postpone her surgery on 11/21/23 or would you proceed with it?

## 2023-10-29 NOTE — Telephone Encounter (Signed)
 I called and advised patient of Dr. Jeraline message and she was relieved to hear this. I did advise her to make she is not eating the breads, pastas, and to stay away from sugary drinks and treats

## 2023-10-29 NOTE — Telephone Encounter (Signed)
 Pt called requesting a call from Dr georgina. Pt states she is schedule for surgery and has additional medical questions. Please call pt at [phone]

## 2023-11-06 ENCOUNTER — Encounter: Admitting: Orthopedic Surgery

## 2023-11-09 ENCOUNTER — Other Ambulatory Visit: Payer: Self-pay | Admitting: Orthopedic Surgery

## 2023-11-11 LAB — COLOGUARD: COLOGUARD: NEGATIVE

## 2023-11-20 ENCOUNTER — Encounter (HOSPITAL_COMMUNITY): Payer: Self-pay | Admitting: Orthopedic Surgery

## 2023-11-20 NOTE — Anesthesia Preprocedure Evaluation (Addendum)
 Anesthesia Evaluation  Patient identified by MRN, date of birth, ID band Patient awake    Reviewed: Allergy & Precautions, NPO status , Patient's Chart, lab work & pertinent test results  History of Anesthesia Complications Negative for: history of anesthetic complications  Airway Mallampati: II  TM Distance: >3 FB Neck ROM: Full   Comment: Previous grade I view with Miller 2, easy mask Dental  (+) Dental Advisory Given   Pulmonary neg shortness of breath, neg COPD, Recent URI , Resolved   Pulmonary exam normal breath sounds clear to auscultation       Cardiovascular hypertension (triamterene -HCTZ), Pt. on medications (-) angina (-) Past MI, (-) Cardiac Stents and (-) CABG (-) dysrhythmias  Rhythm:Regular Rate:Normal     Neuro/Psych neg Seizures  Neuromuscular disease (lumbar radiculopathy)    GI/Hepatic ,GERD  Medicated,,Fatty liver   Endo/Other  diabetes, Poorly Controlled, Type 2, Oral Hypoglycemic Agents    Renal/GU negative Renal ROS     Musculoskeletal  (+) Arthritis ,    Abdominal   Peds  Hematology negative hematology ROS (+) Lab Results      Component                Value               Date                      WBC                      7.2                 11/21/2023                HGB                      15.1 (H)            11/21/2023                HCT                      46.6 (H)            11/21/2023                MCV                      96.5                11/21/2023                PLT                      260                 11/21/2023               Anesthesia Other Findings Patient has had some nausea (no vomiting) and soft stools (not watery) yesterday. She denies any other symptoms including fever, SOB, rhinorrhea, productive cough, and sick contacts. She has not had any BMs today. She is afebrile with a normal white count. When asked if she felt well to undergo this extensive surgery, she  stated yes.  Reproductive/Obstetrics  Anesthesia Physical Anesthesia Plan  ASA: 3  Anesthesia Plan: General   Post-op Pain Management: Tylenol  PO (pre-op)*   Induction: Intravenous  PONV Risk Score and Plan: 3 and Ondansetron , Dexamethasone  and Treatment may vary due to age or medical condition  Airway Management Planned: Oral ETT  Additional Equipment: Arterial line  Intra-op Plan:   Post-operative Plan: Extubation in OR  Informed Consent: I have reviewed the patients History and Physical, chart, labs and discussed the procedure including the risks, benefits and alternatives for the proposed anesthesia with the patient or authorized representative who has indicated his/her understanding and acceptance.     Dental advisory given  Plan Discussed with:   Anesthesia Plan Comments: (PAT note written by Allison Zelenak, PA-C. Case postponed for a month after she developed URI symptoms on 10/21/2023.   Risks of general anesthesia discussed including, but not limited to, sore throat, hoarse voice, chipped/damaged teeth, injury to vocal cords, nausea and vomiting, allergic reactions, lung infection, heart attack, stroke, and death. All questions answered. )         Anesthesia Quick Evaluation

## 2023-11-20 NOTE — H&P (Signed)
 Orthopedic Spine Surgery H&P Note  Assessment: Patient is a 73 y.o. female with lumbar radiculopathy    Plan: -Out of bed as tolerated, activity as tolerated, no brace -Covered the risks of surgery one more time with the patient and patient elected to proceed with planned surgery -Written consent verified -Hold anticoagulation in anticipation of surgery -Ancef  and TXA on all to OR -NPO for procedure -Site marked -To OR when ready  The risks covered with her this morning included but were not limited to: dural tear, pseudarthosis, nerve root injury, paralysis, persistent pain, dvt/pe, infection, bleeding, hardware failure/malposition, adjacent segment disease, vascular injury, bowel injury, heart attack, death, fracture, and need for additional procedures. ___________________________________________________________________________  Chief Complaint: low back pain that radiates into lower extremities  History: Patient is 73 y.o. female who has been previously seen in the office for low back pain that radiates into bilateral lower extremities. Work up was consistent with lumbar radiculopathy. Her symptoms failed to improve with conservative treatment so operative management was discussed at the last office visit. The patient presents today with no changes in her symptoms since the last office visit. See previous office note for further details.    Review of systems: General: denies fevers and chills, myalgias Neurologic: denies recent changes in vision, slurred speech Abdomen: denies nausea, vomiting, hematemesis Respiratory: denies cough, shortness of breath  Past medical history: Hypertension GERD Diabetes Chronic pain   Allergies: Penicillin   Past surgical history:  Cholecystectomy C-section Forearm fracture ORIF L4/5 laminectomy   Social history: Denies use of nicotine product (smoking, vaping, patches, smokeless) Alcohol  use: 1 drink per week Denies recreational drug  use  Family history: -reviewed and not pertinent to lumbar radiculopathy   Physical Exam:  General: no acute distress, appears stated age Neurologic: alert, answering questions appropriately, following commands Cardiovascular: regular rate, no cyanosis Respiratory: unlabored breathing on room air, symmetric chest rise Psychiatric: appropriate affect, normal cadence to speech   MSK (spine):  -Strength exam      Left  Right  EHL    5/5  5/5 TA    5/5  5/5 GSC    5/5  5/5 Knee extension  5/5  5/5 Knee flexion   5/5  5/5 Hip flexion   5/5  5/5  -Sensory exam    Sensation intact to light touch in L3-S1 nerve distributions of bilateral lower extremities   Patient name: Rachel Burns Patient MRN: [id number] Date: 11/21/2023

## 2023-11-20 NOTE — Progress Notes (Signed)
 SDW CALL  Patient was given pre-op instructions over the phone. The opportunity was given for the patient to ask questions. No further questions asked. Patient verbalized understanding of instructions given.   PCP - Lonell Collet Cardiologist - denies  PPM/ICD - denies   Chest x-ray - denies EKG - DOS Stress Test - denies ECHO - denies Cardiac Cath - denies  Sleep Study - denies CPAP - n/a  Fasting Blood Sugar - patient states that she does not check blood sugar at home   Last dose of GLP1 agonist-  n/a GLP1 instructions:  n/a  Last dose of Jardiance was Sunday, 9/14  Blood Thinner Instructions: n/a Aspirin Instructions: n/a  ERAS Protcol - clears until 0430 PRE-SURGERY Ensure or G2- G2 as ordered and given at previous PAT appointment  COVID TEST- n/a   Anesthesia review: previously reviewed by anesthesia after PAT appointment.  Original surgery date was canceled due to patient becoming sick on the evening of 8/19.  Patient saw her PCP on 8/26 and was on antibiotic for 10 days for cough, sore throat.  Patient stated that after antibiotic she felt better and cough was minimal.  Patient did state that she had a slight cough today but it was due to allergies from husband shampooing the carpets.  Notified Lynwood and Isaiah and informed patient that she would be seen by anesthesia on the day of surgery but if symptoms progressed today, to call the surgeon's office.  Patient verbalized understanding.  Patient only coughed once while on the phone and cough seemed to be a dry, scratchy cough.      All instructions explained to the patient, with a verbal understanding of the material. Patient agrees to go over the instructions while at home for a better understanding.

## 2023-11-21 ENCOUNTER — Ambulatory Visit (HOSPITAL_COMMUNITY)

## 2023-11-21 ENCOUNTER — Other Ambulatory Visit: Payer: Self-pay

## 2023-11-21 ENCOUNTER — Inpatient Hospital Stay (HOSPITAL_COMMUNITY): Payer: Self-pay | Admitting: Vascular Surgery

## 2023-11-21 ENCOUNTER — Encounter (HOSPITAL_COMMUNITY): Payer: Self-pay | Admitting: Orthopedic Surgery

## 2023-11-21 ENCOUNTER — Encounter (HOSPITAL_COMMUNITY): Payer: Self-pay | Admitting: Vascular Surgery

## 2023-11-21 ENCOUNTER — Encounter (HOSPITAL_COMMUNITY)
Admission: RE | Disposition: A | Discharge status: Home / Self Care | Payer: Self-pay | Source: Home / Self Care | Attending: Orthopedic Surgery

## 2023-11-21 ENCOUNTER — Inpatient Hospital Stay (HOSPITAL_COMMUNITY)
Admission: RE | Admit: 2023-11-21 | Discharge: 2023-11-23 | DRG: 402 | Disposition: A | Discharge status: Home / Self Care | Attending: Orthopedic Surgery | Admitting: Orthopedic Surgery

## 2023-11-21 DIAGNOSIS — I10 Essential (primary) hypertension: Secondary | ICD-10-CM | POA: Diagnosis present

## 2023-11-21 DIAGNOSIS — Z9049 Acquired absence of other specified parts of digestive tract: Secondary | ICD-10-CM

## 2023-11-21 DIAGNOSIS — Z01818 Encounter for other preprocedural examination: Principal | ICD-10-CM

## 2023-11-21 DIAGNOSIS — Z7985 Long-term (current) use of injectable non-insulin antidiabetic drugs: Secondary | ICD-10-CM

## 2023-11-21 DIAGNOSIS — Z23 Encounter for immunization: Secondary | ICD-10-CM

## 2023-11-21 DIAGNOSIS — K760 Fatty (change of) liver, not elsewhere classified: Secondary | ICD-10-CM | POA: Diagnosis present

## 2023-11-21 DIAGNOSIS — M48061 Spinal stenosis, lumbar region without neurogenic claudication: Secondary | ICD-10-CM | POA: Diagnosis present

## 2023-11-21 DIAGNOSIS — M5416 Radiculopathy, lumbar region: Secondary | ICD-10-CM | POA: Diagnosis not present

## 2023-11-21 DIAGNOSIS — M51369 Other intervertebral disc degeneration, lumbar region without mention of lumbar back pain or lower extremity pain: Secondary | ICD-10-CM | POA: Diagnosis present

## 2023-11-21 DIAGNOSIS — G8918 Other acute postprocedural pain: Secondary | ICD-10-CM | POA: Diagnosis not present

## 2023-11-21 DIAGNOSIS — K219 Gastro-esophageal reflux disease without esophagitis: Secondary | ICD-10-CM | POA: Diagnosis present

## 2023-11-21 DIAGNOSIS — G8929 Other chronic pain: Secondary | ICD-10-CM | POA: Diagnosis present

## 2023-11-21 DIAGNOSIS — M4316 Spondylolisthesis, lumbar region: Secondary | ICD-10-CM | POA: Diagnosis present

## 2023-11-21 DIAGNOSIS — R110 Nausea: Secondary | ICD-10-CM | POA: Diagnosis not present

## 2023-11-21 DIAGNOSIS — Z981 Arthrodesis status: Secondary | ICD-10-CM

## 2023-11-21 DIAGNOSIS — E119 Type 2 diabetes mellitus without complications: Secondary | ICD-10-CM | POA: Diagnosis present

## 2023-11-21 HISTORY — PX: ABDOMINAL EXPOSURE: SHX5708

## 2023-11-21 HISTORY — PX: ANTERIOR LAT LUMBAR FUSION: SHX1168

## 2023-11-21 LAB — BASIC METABOLIC PANEL WITH GFR
Anion gap: 12 (ref 5–15)
BUN: 15 mg/dL (ref 8–23)
CO2: 22 mmol/L (ref 22–32)
Calcium: 9 mg/dL (ref 8.9–10.3)
Chloride: 101 mmol/L (ref 98–111)
Creatinine, Ser: 0.87 mg/dL (ref 0.44–1.00)
GFR, Estimated: >60 mL/min (ref >60)
Glucose, Bld: 187 mg/dL — ABNORMAL HIGH (ref 70–99)
Potassium: 3 mmol/L — ABNORMAL LOW (ref 3.5–5.1)
Sodium: 135 mmol/L (ref 135–145)

## 2023-11-21 LAB — GLUCOSE, CAPILLARY
Glucose-Capillary: 190 mg/dL — ABNORMAL HIGH (ref 70–99)
Glucose-Capillary: 169 mg/dL — ABNORMAL HIGH (ref 70–99)
Glucose-Capillary: 170 mg/dL — ABNORMAL HIGH (ref 70–99)
Glucose-Capillary: 192 mg/dL — ABNORMAL HIGH (ref 70–99)
Glucose-Capillary: 184 mg/dL — ABNORMAL HIGH (ref 70–99)
Glucose-Capillary: 149 mg/dL — ABNORMAL HIGH (ref 70–99)

## 2023-11-21 LAB — CBC
HCT: 46.6 % — ABNORMAL HIGH (ref 36.0–46.0)
Hemoglobin: 15.1 g/dL — ABNORMAL HIGH (ref 12.0–15.0)
MCH: 31.3 pg (ref 26.0–34.0)
MCHC: 32.4 g/dL (ref 30.0–36.0)
MCV: 96.5 fL (ref 80.0–100.0)
Platelets: 260 K/uL (ref 150–400)
RBC: 4.83 MIL/uL (ref 3.87–5.11)
RDW: 12.7 % (ref 11.5–15.5)
WBC: 7.2 K/uL (ref 4.0–10.5)
nRBC: 0 % (ref 0.0–0.2)

## 2023-11-21 LAB — TYPE AND SCREEN
ABO/RH(D): A POS
Antibody Screen: NEGATIVE

## 2023-11-21 LAB — SURGICAL PCR SCREEN
MRSA, PCR: NEGATIVE
Staphylococcus aureus: NEGATIVE

## 2023-11-21 SURGERY — ANTERIOR LATERAL LUMBAR FUSION 1 LEVEL
Anesthesia: General

## 2023-11-21 MED ORDER — OXYCODONE HCL 5 MG/5ML PO SOLN: 5.0000 mg | Freq: Once | ORAL | Status: AC | PRN

## 2023-11-21 MED ORDER — PANTOPRAZOLE SODIUM 40 MG PO TBEC
80.0000 mg | DELAYED_RELEASE_TABLET | Freq: Every day | ORAL | Status: DC
  Administered 2023-11-21 – 2023-11-23 (×3): 80 mg via ORAL
  Filled 2023-11-21 (×3): qty 2

## 2023-11-21 MED ORDER — INSULIN ASPART 100 UNIT/ML IJ SOLN
0.0000 [IU] | Freq: Three times a day (TID) | INTRAMUSCULAR | Status: DC
  Administered 2023-11-21: 3 [IU] via SUBCUTANEOUS
  Administered 2023-11-22: 5 [IU] via SUBCUTANEOUS
  Administered 2023-11-23 (×2): 3 [IU] via SUBCUTANEOUS

## 2023-11-21 MED ORDER — POLYVINYL ALCOHOL 1.4 % OP SOLN
1.0000 [drp] | Freq: Two times a day (BID) | OPHTHALMIC | Status: DC
  Administered 2023-11-21 – 2023-11-23 (×4): 1 [drp] via OPHTHALMIC
  Filled 2023-11-21: qty 15

## 2023-11-21 MED ORDER — HYDROMORPHONE HCL 1 MG/ML IJ SOLN
1.0000 mg | INTRAMUSCULAR | Status: DC | PRN
  Administered 2023-11-21 – 2023-11-22 (×2): 1 mg via INTRAVENOUS
  Filled 2023-11-21 (×2): qty 1

## 2023-11-21 MED ORDER — PREGABALIN 100 MG PO CAPS
100.0000 mg | ORAL_CAPSULE | Freq: Two times a day (BID) | ORAL | Status: DC
  Administered 2023-11-21 – 2023-11-23 (×4): 100 mg via ORAL
  Filled 2023-11-21 (×4): qty 1

## 2023-11-21 MED ORDER — MIDAZOLAM HCL 2 MG/2ML IJ SOLN
INTRAMUSCULAR | Status: AC
  Filled 2023-11-21: qty 2

## 2023-11-21 MED ORDER — FENTANYL CITRATE (PF) 250 MCG/5ML IJ SOLN
INTRAMUSCULAR | Status: AC
  Filled 2023-11-21: qty 5

## 2023-11-21 MED ORDER — INSULIN ASPART 100 UNIT/ML IJ SOLN
0.0000 [IU] | INTRAMUSCULAR | Status: DC | PRN
  Administered 2023-11-21: 2 [IU] via SUBCUTANEOUS

## 2023-11-21 MED ORDER — POLYETHYLENE GLYCOL 3350 17 G PO PACK
17.0000 g | PACK | Freq: Every day | ORAL | Status: DC
  Administered 2023-11-22 – 2023-11-23 (×2): 17 g via ORAL
  Filled 2023-11-21 (×2): qty 1

## 2023-11-21 MED ORDER — BRIMONIDINE TARTRATE 0.15 % OP SOLN
1.0000 [drp] | Freq: Two times a day (BID) | OPHTHALMIC | Status: DC
Start: 2023-11-21 — End: 2023-11-23
  Administered 2023-11-22 – 2023-11-23 (×2): 1 [drp] via OPHTHALMIC
  Filled 2023-11-21: qty 5

## 2023-11-21 MED ORDER — MIDAZOLAM HCL 2 MG/2ML IJ SOLN
INTRAMUSCULAR | Status: DC | PRN
  Administered 2023-11-21: 2 mg via INTRAVENOUS

## 2023-11-21 MED ORDER — OXYCODONE HCL 5 MG PO TABS
ORAL_TABLET | ORAL | Status: AC
  Filled 2023-11-21: qty 1

## 2023-11-21 MED ORDER — PROPOFOL 10 MG/ML IV BOLUS
INTRAVENOUS | Status: AC
  Filled 2023-11-21: qty 20

## 2023-11-21 MED ORDER — INSULIN ASPART 100 UNIT/ML IJ SOLN
INTRAMUSCULAR | Status: AC
  Filled 2023-11-21: qty 1

## 2023-11-21 MED ORDER — ONDANSETRON HCL 4 MG/2ML IJ SOLN
INTRAMUSCULAR | Status: DC | PRN
  Administered 2023-11-21: 4 mg via INTRAVENOUS

## 2023-11-21 MED ORDER — OXYCODONE HCL 5 MG PO TABS
5.0000 mg | ORAL_TABLET | ORAL | Status: DC | PRN
  Administered 2023-11-21: 5 mg via ORAL
  Administered 2023-11-22 – 2023-11-23 (×4): 10 mg via ORAL
  Filled 2023-11-21 (×2): qty 2
  Filled 2023-11-21: qty 1
  Filled 2023-11-21 (×2): qty 2

## 2023-11-21 MED ORDER — POVIDONE-IODINE 10 % EX SWAB
2.0000 | Freq: Once | CUTANEOUS | Status: AC
  Administered 2023-11-21: 2 via TOPICAL

## 2023-11-21 MED ORDER — LATANOPROST 0.005 % OP SOLN
1.0000 [drp] | Freq: Every day | OPHTHALMIC | Status: DC
  Administered 2023-11-22: 1 [drp] via OPHTHALMIC
  Filled 2023-11-21: qty 2.5

## 2023-11-21 MED ORDER — 0.9 % SODIUM CHLORIDE (POUR BTL) OPTIME
TOPICAL | Status: DC | PRN
  Administered 2023-11-21: 1000 mL

## 2023-11-21 MED ORDER — AMISULPRIDE (ANTIEMETIC) 5 MG/2ML IV SOLN
INTRAVENOUS | Status: AC
  Filled 2023-11-21: qty 4

## 2023-11-21 MED ORDER — ALBUMIN HUMAN 5 % IV SOLN: INTRAVENOUS | Status: DC | PRN

## 2023-11-21 MED ORDER — PNEUMOCOCCAL 20-VAL CONJ VACC 0.5 ML IM SUSY
0.5000 mL | PREFILLED_SYRINGE | INTRAMUSCULAR | Status: AC
Start: 2023-11-22 — End: 2023-11-22
  Administered 2023-11-22: 0.5 mL via INTRAMUSCULAR
  Filled 2023-11-21: qty 0.5

## 2023-11-21 MED ORDER — INFLUENZA VAC SPLIT HIGH-DOSE 0.5 ML IM SUSY
0.5000 mL | PREFILLED_SYRINGE | INTRAMUSCULAR | Status: AC
  Administered 2023-11-22: 0.5 mL via INTRAMUSCULAR
  Filled 2023-11-21: qty 0.5

## 2023-11-21 MED ORDER — ONDANSETRON HCL 4 MG PO TABS: 4.0000 mg | ORAL_TABLET | Freq: Four times a day (QID) | ORAL | Status: DC | PRN

## 2023-11-21 MED ORDER — TRANEXAMIC ACID-NACL 1000-0.7 MG/100ML-% IV SOLN
1000.0000 mg | INTRAVENOUS | Status: AC
  Administered 2023-11-21: 1000 mg via INTRAVENOUS
  Filled 2023-11-21: qty 100

## 2023-11-21 MED ORDER — LACTATED RINGERS IV SOLN: INTRAVENOUS | Status: DC

## 2023-11-21 MED ORDER — LIDOCAINE 2% (20 MG/ML) 5 ML SYRINGE
INTRAMUSCULAR | Status: AC
  Filled 2023-11-21: qty 5

## 2023-11-21 MED ORDER — BUPIVACAINE-EPINEPHRINE (PF) 0.25% -1:200000 IJ SOLN
INTRAMUSCULAR | Status: DC | PRN
  Administered 2023-11-21: 30 mL via PERINEURAL
  Administered 2023-11-21: 30 mL

## 2023-11-21 MED ORDER — LIDOCAINE 2% (20 MG/ML) 5 ML SYRINGE
INTRAMUSCULAR | Status: DC | PRN
  Administered 2023-11-21: 100 mg via INTRAVENOUS

## 2023-11-21 MED ORDER — ONDANSETRON HCL 4 MG/2ML IJ SOLN
4.0000 mg | Freq: Four times a day (QID) | INTRAMUSCULAR | Status: DC | PRN
  Administered 2023-11-21 – 2023-11-22 (×2): 4 mg via INTRAVENOUS
  Filled 2023-11-21 (×2): qty 2

## 2023-11-21 MED ORDER — ONDANSETRON HCL 4 MG/2ML IJ SOLN
INTRAMUSCULAR | Status: AC
  Filled 2023-11-21: qty 2

## 2023-11-21 MED ORDER — THROMBIN 5000 UNITS EX KIT
PACK | CUTANEOUS | Status: AC
  Filled 2023-11-21: qty 1

## 2023-11-21 MED ORDER — SUGAMMADEX SODIUM 200 MG/2ML IV SOLN
INTRAVENOUS | Status: DC | PRN
  Administered 2023-11-21: 200 mg via INTRAVENOUS

## 2023-11-21 MED ORDER — CHLORHEXIDINE GLUCONATE 0.12 % MT SOLN
15.0000 mL | Freq: Once | OROMUCOSAL | Status: AC
  Administered 2023-11-21: 15 mL via OROMUCOSAL
  Filled 2023-11-21: qty 15

## 2023-11-21 MED ORDER — SURGIFLO WITH THROMBIN (HEMOSTATIC MATRIX KIT) OPTIME
TOPICAL | Status: DC | PRN
  Administered 2023-11-21: 1 via TOPICAL

## 2023-11-21 MED ORDER — TRANEXAMIC ACID-NACL 1000-0.7 MG/100ML-% IV SOLN
1000.0000 mg | Freq: Once | INTRAVENOUS | Status: AC
  Administered 2023-11-21: 1000 mg via INTRAVENOUS
  Filled 2023-11-21: qty 100

## 2023-11-21 MED ORDER — LATANOPROSTENE BUNOD 0.024 % OP SOLN: 1.0000 [drp] | Freq: Every day | OPHTHALMIC | Status: DC

## 2023-11-21 MED ORDER — METHOCARBAMOL 500 MG PO TABS
500.0000 mg | ORAL_TABLET | Freq: Four times a day (QID) | ORAL | Status: DC
  Administered 2023-11-22: 500 mg via ORAL
  Filled 2023-11-21 (×3): qty 1

## 2023-11-21 MED ORDER — VANCOMYCIN HCL 1000 MG IV SOLR
INTRAVENOUS | Status: AC
  Filled 2023-11-21: qty 20

## 2023-11-21 MED ORDER — FENTANYL CITRATE (PF) 250 MCG/5ML IJ SOLN
INTRAMUSCULAR | Status: DC | PRN
  Administered 2023-11-21 (×3): 50 ug via INTRAVENOUS
  Administered 2023-11-21: 100 ug via INTRAVENOUS

## 2023-11-21 MED ORDER — SODIUM CHLORIDE 0.9 % IV SOLN: INTRAVENOUS | Status: DC | PRN

## 2023-11-21 MED ORDER — PHENYLEPHRINE HCL-NACL 20-0.9 MG/250ML-% IV SOLN
INTRAVENOUS | Status: DC | PRN
  Administered 2023-11-21: 20 ug/min via INTRAVENOUS

## 2023-11-21 MED ORDER — DEXAMETHASONE SODIUM PHOSPHATE 10 MG/ML IJ SOLN
10.0000 mg | Freq: Once | INTRAMUSCULAR | Status: AC
  Administered 2023-11-21: 5 mg via INTRAVENOUS
  Filled 2023-11-21: qty 1

## 2023-11-21 MED ORDER — ROCURONIUM BROMIDE 10 MG/ML (PF) SYRINGE
PREFILLED_SYRINGE | INTRAVENOUS | Status: DC | PRN
  Administered 2023-11-21: 20 mg via INTRAVENOUS
  Administered 2023-11-21: 50 mg via INTRAVENOUS
  Administered 2023-11-21: 10 mg via INTRAVENOUS
  Administered 2023-11-21: 20 mg via INTRAVENOUS

## 2023-11-21 MED ORDER — OXYCODONE HCL 5 MG PO TABS
5.0000 mg | ORAL_TABLET | Freq: Once | ORAL | Status: AC | PRN
  Administered 2023-11-21: 5 mg via ORAL

## 2023-11-21 MED ORDER — ACETAMINOPHEN 500 MG PO TABS
1000.0000 mg | ORAL_TABLET | Freq: Once | ORAL | Status: AC
  Administered 2023-11-21: 1000 mg via ORAL
  Filled 2023-11-21: qty 2

## 2023-11-21 MED ORDER — ORAL CARE MOUTH RINSE: 15.0000 mL | Freq: Once | OROMUCOSAL | Status: AC

## 2023-11-21 MED ORDER — AMISULPRIDE (ANTIEMETIC) 5 MG/2ML IV SOLN
10.0000 mg | Freq: Once | INTRAVENOUS | Status: AC | PRN
  Administered 2023-11-21: 10 mg via INTRAVENOUS

## 2023-11-21 MED ORDER — VITAMIN D 25 MCG (1000 UNIT) PO TABS
1000.0000 [IU] | ORAL_TABLET | Freq: Every day | ORAL | Status: DC
Start: 2023-11-21 — End: 2023-11-23
  Administered 2023-11-22 – 2023-11-23 (×2): 1000 [IU] via ORAL
  Filled 2023-11-21 (×2): qty 1

## 2023-11-21 MED ORDER — TRIAMTERENE-HCTZ 37.5-25 MG PO TABS
1.0000 | ORAL_TABLET | Freq: Every day | ORAL | Status: DC
  Administered 2023-11-22 – 2023-11-23 (×2): 1 via ORAL
  Filled 2023-11-21 (×2): qty 1

## 2023-11-21 MED ORDER — SENNA 8.6 MG PO TABS
1.0000 | ORAL_TABLET | Freq: Two times a day (BID) | ORAL | Status: DC
  Administered 2023-11-22 – 2023-11-23 (×3): 8.6 mg via ORAL
  Filled 2023-11-21 (×3): qty 1

## 2023-11-21 MED ORDER — STERILE WATER FOR IRRIGATION IR SOLN
Status: DC | PRN
  Administered 2023-11-21: 1000 mL

## 2023-11-21 MED ORDER — INSULIN ASPART 100 UNIT/ML IJ SOLN
0.0000 [IU] | Freq: Every day | INTRAMUSCULAR | Status: DC
  Administered 2023-11-22: 2 [IU] via SUBCUTANEOUS

## 2023-11-21 MED ORDER — BUPIVACAINE-EPINEPHRINE (PF) 0.25% -1:200000 IJ SOLN
INTRAMUSCULAR | Status: AC
Start: 2023-11-21 — End: 2023-11-21
  Filled 2023-11-21: qty 30

## 2023-11-21 MED ORDER — ROCURONIUM BROMIDE 10 MG/ML (PF) SYRINGE
PREFILLED_SYRINGE | INTRAVENOUS | Status: AC
  Filled 2023-11-21: qty 10

## 2023-11-21 MED ORDER — BUPIVACAINE-EPINEPHRINE (PF) 0.25% -1:200000 IJ SOLN
INTRAMUSCULAR | Status: AC
  Filled 2023-11-21: qty 30

## 2023-11-21 MED ORDER — PROPOFOL 10 MG/ML IV BOLUS
INTRAVENOUS | Status: DC | PRN
  Administered 2023-11-21: 150 mg via INTRAVENOUS

## 2023-11-21 MED ORDER — ADULT MULTIVITAMIN W/MINERALS CH
1.0000 | ORAL_TABLET | Freq: Every day | ORAL | Status: DC
  Administered 2023-11-22 – 2023-11-23 (×2): 1 via ORAL
  Filled 2023-11-21 (×2): qty 1

## 2023-11-21 MED ORDER — CEFAZOLIN SODIUM-DEXTROSE 2-4 GM/100ML-% IV SOLN: 2.0000 g | INTRAVENOUS | Status: DC

## 2023-11-21 MED ORDER — CEFAZOLIN SODIUM-DEXTROSE 2-4 GM/100ML-% IV SOLN
2.0000 g | INTRAVENOUS | Status: AC
  Administered 2023-11-21: 2 g via INTRAVENOUS
  Filled 2023-11-21: qty 100

## 2023-11-21 MED ORDER — AZELASTINE HCL 0.1 % NA SOLN: 2.0000 | Freq: Two times a day (BID) | NASAL | Status: DC | PRN

## 2023-11-21 MED ORDER — PHENYLEPHRINE 80 MCG/ML (10ML) SYRINGE FOR IV PUSH (FOR BLOOD PRESSURE SUPPORT)
PREFILLED_SYRINGE | INTRAVENOUS | Status: DC | PRN
  Administered 2023-11-21: 80 ug via INTRAVENOUS
  Administered 2023-11-21: 160 ug via INTRAVENOUS
  Administered 2023-11-21 (×3): 80 ug via INTRAVENOUS

## 2023-11-21 MED ORDER — TRANEXAMIC ACID-NACL 1000-0.7 MG/100ML-% IV SOLN: 1000.0000 mg | INTRAVENOUS | Status: DC

## 2023-11-21 MED ORDER — CEFAZOLIN SODIUM-DEXTROSE 2-4 GM/100ML-% IV SOLN
2.0000 g | Freq: Four times a day (QID) | INTRAVENOUS | Status: AC
  Administered 2023-11-21 (×2): 2 g via INTRAVENOUS
  Filled 2023-11-21 (×2): qty 100

## 2023-11-21 MED ORDER — ACETAMINOPHEN 500 MG PO TABS
1000.0000 mg | ORAL_TABLET | Freq: Three times a day (TID) | ORAL | Status: DC
  Administered 2023-11-21 – 2023-11-23 (×7): 1000 mg via ORAL
  Filled 2023-11-21 (×7): qty 2

## 2023-11-21 MED ORDER — HYDROMORPHONE HCL 1 MG/ML IJ SOLN
0.2500 mg | INTRAMUSCULAR | Status: DC | PRN
  Administered 2023-11-21 (×2): 0.25 mg via INTRAVENOUS
  Administered 2023-11-21: 0.5 mg via INTRAVENOUS

## 2023-11-21 MED ORDER — GLUCERNA SHAKE PO LIQD
237.0000 mL | Freq: Two times a day (BID) | ORAL | Status: DC
  Administered 2023-11-22 – 2023-11-23 (×2): 237 mL via ORAL

## 2023-11-21 MED ORDER — HYDROMORPHONE HCL 1 MG/ML IJ SOLN
INTRAMUSCULAR | Status: AC
  Filled 2023-11-21: qty 1

## 2023-11-21 SURGICAL SUPPLY — 96 items
ALCOHOL 70% 16 OZ (MISCELLANEOUS) ×1 IMPLANT
ANCHOR LUMBAR 25 MIS (Anchor) IMPLANT
BAG COUNTER SPONGE SURGICOUNT (BAG) ×2 IMPLANT
BLADE CLIPPER SURG (BLADE) IMPLANT
BLADE SURG 10 STRL SS (BLADE) IMPLANT
BUR 14 MATCH 3 (BUR) IMPLANT
BUR MR8 14CM BALL SYMTRI 5 (BUR) IMPLANT
BUR NEURO DRILL SOFT 3.0X3.8M (BURR) ×1 IMPLANT
CANISTER SUCTION 3000ML PPV (SUCTIONS) ×1 IMPLANT
CATH FOLEY 2WAY SLVR 5CC 16FR (CATHETERS) ×1 IMPLANT
CLIP APPLIE 11 MED OPEN (CLIP) ×1 IMPLANT
CLIP LIGATING EXTRA MED SLVR (CLIP) IMPLANT
COVER SURGICAL LIGHT HANDLE (MISCELLANEOUS) ×2 IMPLANT
DERMABOND ADVANCED .7 DNX12 (GAUZE/BANDAGES/DRESSINGS) ×3 IMPLANT
DISSECTOR BLUNT TIP ENDO 5MM (MISCELLANEOUS) ×1 IMPLANT
DRAIN HEMOVAC 7FR (DRAIN) IMPLANT
DRAPE C-ARM 42X72 X-RAY (DRAPES) ×2 IMPLANT
DRAPE C-ARMOR (DRAPES) ×1 IMPLANT
DRAPE INCISE IOBAN 66X45 STRL (DRAPES) ×1 IMPLANT
DRAPE SHEET LG 3/4 BI-LAMINATE (DRAPES) ×6 IMPLANT
DRAPE SURG 17X23 STRL (DRAPES) ×4 IMPLANT
DRAPE UTILITY XL STRL (DRAPES) ×3 IMPLANT
DRESSING MEPILEX FLEX 4X4 (GAUZE/BANDAGES/DRESSINGS) ×4 IMPLANT
DRSG COVADERM 4X10 (GAUZE/BANDAGES/DRESSINGS) ×2 IMPLANT
DRSG MEPILEX POST OP 4X8 (GAUZE/BANDAGES/DRESSINGS) IMPLANT
DRSG TEGADERM 4X10 (GAUZE/BANDAGES/DRESSINGS) ×2 IMPLANT
DRSG TEGADERM 4X4.75 (GAUZE/BANDAGES/DRESSINGS) ×4 IMPLANT
DURAPREP 26ML APPLICATOR (WOUND CARE) ×2 IMPLANT
ELECT COATED BLADE 2.86 ST (ELECTRODE) ×2 IMPLANT
ELECT PENCIL ROCKER SW 15FT (MISCELLANEOUS) ×2 IMPLANT
ELECTRODE BLDE 4.0 EZ CLN MEGD (MISCELLANEOUS) ×1 IMPLANT
ELECTRODE REM PT RTRN 9FT ADLT (ELECTROSURGICAL) ×2 IMPLANT
FEE COVERAGE SUPPORT O-ARM (MISCELLANEOUS) ×1 IMPLANT
GAUZE 4X4 16PLY ~~LOC~~+RFID DBL (SPONGE) ×1 IMPLANT
GAUZE SPONGE 4X4 12PLY STRL (GAUZE/BANDAGES/DRESSINGS) ×1 IMPLANT
GLOVE BIO SURGEON STRL SZ7.5 (GLOVE) ×3 IMPLANT
GLOVE BIOGEL PI IND STRL 7.0 (GLOVE) ×1 IMPLANT
GLOVE BIOGEL PI IND STRL 7.5 (GLOVE) IMPLANT
GLOVE BIOGEL PI IND STRL 8 (GLOVE) ×1 IMPLANT
GLOVE ECLIPSE 7.5 STRL STRAW (GLOVE) IMPLANT
GLOVE INDICATOR 6.5 STRL GRN (GLOVE) IMPLANT
GLOVE INDICATOR 7.5 STRL GRN (GLOVE) ×3 IMPLANT
GLOVE INDICATOR 8.0 STRL GRN (GLOVE) ×1 IMPLANT
GLOVE SS BIOGEL STRL SZ 7.5 (GLOVE) ×4 IMPLANT
GOWN STRL REUS W/ TWL LRG LVL3 (GOWN DISPOSABLE) ×2 IMPLANT
GOWN STRL REUS W/ TWL XL LVL3 (GOWN DISPOSABLE) ×1 IMPLANT
GOWN STRL SURGICAL XL XLNG (GOWN DISPOSABLE) ×3 IMPLANT
GRAFT BNE FBR PLIAFX PRIME 10 (Bone Implant) IMPLANT
INSERT FOGARTY 61MM (MISCELLANEOUS) IMPLANT
INSERT FOGARTY SM (MISCELLANEOUS) IMPLANT
KIT BASIN OR (CUSTOM PROCEDURE TRAY) ×2 IMPLANT
KIT POSITIONER JACKSON TABLE (MISCELLANEOUS) ×1 IMPLANT
KIT TURNOVER KIT B (KITS) ×2 IMPLANT
MARKER SKIN DUAL TIP RULER LAB (MISCELLANEOUS) ×2 IMPLANT
MARKER SPHERE PSV REFLC NDI (MISCELLANEOUS) ×5 IMPLANT
NDL 22X1.5 STRL (OR ONLY) (MISCELLANEOUS) ×1 IMPLANT
NEEDLE 22X1.5 STRL (OR ONLY) (MISCELLANEOUS) ×1 IMPLANT
NS IRRIG 1000ML POUR BTL (IV SOLUTION) ×2 IMPLANT
PACK LAMINECTOMY ORTHO (CUSTOM PROCEDURE TRAY) ×2 IMPLANT
PACK UNIVERSAL I (CUSTOM PROCEDURE TRAY) ×1 IMPLANT
PAD ARMBOARD POSITIONER FOAM (MISCELLANEOUS) ×4 IMPLANT
PIN BONE FIX 100 (PIN) IMPLANT
ROD RELINE MAS LORD 5.5X45MM (Rod) IMPLANT
ROD SPINAL THRD DISP (MISCELLANEOUS) IMPLANT
SCREW LOCK RELINE 5.5 TULIP (Screw) IMPLANT
SCREW RELINE RED 6.5X45MM POLY (Screw) IMPLANT
SPACER HEDRON IA 26X34X13 8D (Spacer) IMPLANT
SPONGE INTESTINAL PEANUT (DISPOSABLE) ×2 IMPLANT
SPONGE SURGIFOAM ABS GEL 100 (HEMOSTASIS) IMPLANT
SPONGE T-LAP 18X18 ~~LOC~~+RFID (SPONGE) ×1 IMPLANT
SPONGE T-LAP 4X18 ~~LOC~~+RFID (SPONGE) ×2 IMPLANT
SUCTION TUBE FRAZIER 10FR DISP (SUCTIONS) ×1 IMPLANT
SURGIFLO W/THROMBIN 8M KIT (HEMOSTASIS) IMPLANT
SUT BONE WAX W31G (SUTURE) ×1 IMPLANT
SUT MNCRL AB 3-0 PS2 27 (SUTURE) ×2 IMPLANT
SUT PROLENE 4-0 RB1 .5 CRCL 36 (SUTURE) IMPLANT
SUT PROLENE 5 0 CC1 (SUTURE) IMPLANT
SUT PROLENE 6 0 C 1 30 (SUTURE) IMPLANT
SUT PROLENE 6 0 CC (SUTURE) IMPLANT
SUT SILK 0 TIES 10X30 (SUTURE) IMPLANT
SUT SILK 2 0 TIES 10X30 (SUTURE) ×1 IMPLANT
SUT SILK 2 0SH CR/8 30 (SUTURE) IMPLANT
SUT SILK 3 0SH CR/8 30 (SUTURE) IMPLANT
SUT SILK 3-0 18XBRD TIE BLK (SUTURE) IMPLANT
SUT VIC AB 0 CT1 18XCR BRD8 (SUTURE) ×3 IMPLANT
SUT VIC AB 2-0 CT1 18 (SUTURE) ×3 IMPLANT
SUT VIC AB 2-0 CT2 18 VCP726D (SUTURE) ×2 IMPLANT
SUT VIC AB 3-0 PS2 18XBRD (SUTURE) ×1 IMPLANT
SYR BULB IRRIG 60ML STRL (SYRINGE) ×1 IMPLANT
SYR CONTROL 10ML LL (SYRINGE) ×1 IMPLANT
TAPE CLOTH 4X10 WHT NS (GAUZE/BANDAGES/DRESSINGS) ×2 IMPLANT
TOWEL GREEN STERILE (TOWEL DISPOSABLE) ×3 IMPLANT
TOWEL GREEN STERILE FF (TOWEL DISPOSABLE) ×2 IMPLANT
TRAY FOLEY MTR SLVR 14FR STAT (SET/KITS/TRAYS/PACK) IMPLANT
TUBING FEATHERFLOW (TUBING) ×1 IMPLANT
WATER STERILE IRR 1000ML POUR (IV SOLUTION) ×2 IMPLANT

## 2023-11-21 NOTE — Anesthesia Postprocedure Evaluation (Signed)
 Anesthesia Post Note  Patient: Florita Nitsch [facility]  Procedure(s) Performed: ANTERIOR LATERAL LUMBAR FUSION 1 LEVEL POSTERIOR LUMBAR FUSION 1 LEVEL APPLICATION OF O-ARM ABDOMINAL EXPOSURE     Patient location during evaluation: PACU Anesthesia Type: General Level of consciousness: awake Pain management: pain level controlled Vital Signs Assessment: post-procedure vital signs reviewed and stable Respiratory status: spontaneous breathing, nonlabored ventilation and respiratory function stable Cardiovascular status: blood pressure returned to baseline and stable Postop Assessment: no apparent nausea or vomiting Anesthetic complications: no   No notable events documented.  Last Vitals:  Vitals:   11/21/23 1345 11/21/23 1400  BP: 134/66   Pulse: 65   Resp: 14 16  Temp:    SpO2: 98%     Last Pain:  Vitals:   11/21/23 1405  TempSrc:   PainSc: 4                  Delon Aisha Arch

## 2023-11-21 NOTE — Op Note (Signed)
 Date: November 21, 2023  Preoperative diagnosis: Chronic lower back pain  Postoperative diagnosis: Same  Procedure: Anterior retroperitoneal spine exposure for L4-L5 ALIF  Surgeon: Dr. Lonni DOROTHA Gaskins, MD  Co-surgeon: Dr. Ozell Ada, MD  Indications: 73 year old female with chronic lower back pain and lumbar radiculopathy.  She has been evaluated by Dr. Ada who has recommended an L4-L5 ALIF.  She presents for anterior spine exposure after risk-benefits discussed.  Findings: Paramedian incision over the left rectus muscle at the L4-L5 disc space.  The anterior rectus sheath was opened mobilized left rectus muscle to the midline.  I took down some of the posterior sheath above arcuate line.  Peritoneum and left ureter were mobilized across midline.  Mobilized left iliac artery and vein across midline.  Ligated  one segmental branch and one iliolumbar branch.  Fixed NuVasive retractors placed.  Confirmed we were at the correct L4-L5 level with a needle in the disc space on lateral fluoroscopy.  Anesthesia: General  Details: Patient was taken to the operating room after informed consent was obtained.  Placed on table in the supine position.  General endotracheal anesthesia was induced.  Fluoroscopic C-arm was then used in the lateral position to mark the L4-L5 disc space that was marked over the left rectus muscle.  The abdominal wall was then prepped and draped in standard sterile fashion.  Antibiotics were given timeout performed.  Went ahead and made a paramedian incision over the left rectus muscle dissected down Bovie cautery and used cerebellar retractors.  The anterior rectus sheath was encountered and opened this longitudinally with Bovie cautery.  I raised flaps underneath the anterior rectus sheath with a hemostat.  The left rectus muscle was mobilized to the midline with blunt dissection.  I bluntly mobilized into the retroperitoneum with blunt dissection and mobilized peritoneum  off of posterior rectus sheath above arcuate line.  I used Metzenbaum scissors to open up posterior rectus sheath above arcuate line to get to the L4-5 disc space.  I had a small tear in the peritoneum that was repaired with a 3-0 Vicryl.  Dr. Ada then used hand-held Wiley retractors to pull the left iliac artery and vein to the midline as I bluntly dissected these to the midline.  Identified one segmental and one iliolumbar branch that was divided between clips.  I fully mobilized all the way across the L4-5 disc space to get to the contralateral side.  Fixed NuVasive retractor was placed on the field.  I placed 160 reverse lips on each side of the disc space with a 120 reverse lip cranial and 140 reverse lip caudal.  We had good anterior exposure.  Put a needle into the space and confirmed at the correct level on lateral fluoroscopy.  Case was turned over to Dr. Ada.  Complication: None  Condition: Stable  Lonni DOROTHA Gaskins, MD [facility] Office: [phone]   Lonni JINNY Gaskins

## 2023-11-21 NOTE — Anesthesia Procedure Notes (Signed)
 Procedure Name: Intubation Date/Time: 11/21/2023 7:48 AM  Performed by: Cain Fitzhenry C, CRNAPre-anesthesia Checklist: Patient identified, Emergency Drugs available, Suction available and Patient being monitored Patient Re-evaluated:Patient Re-evaluated prior to induction Oxygen Delivery Method: Circle system utilized Preoxygenation: Pre-oxygenation with 100% oxygen Induction Type: IV induction Ventilation: Mask ventilation without difficulty Laryngoscope Size: Mac and 3 Grade View: Grade II Tube type: Oral Tube size: 7.0 mm Number of attempts: 1 Airway Equipment and Method: Stylet and Oral airway Placement Confirmation: ETT inserted through vocal cords under direct vision, positive ETCO2 and breath sounds checked- equal and bilateral Secured at: 22 cm Tube secured with: Tape Dental Injury: Teeth and Oropharynx as per pre-operative assessment

## 2023-11-21 NOTE — Progress Notes (Signed)
 Orthopedic Surgery Post-operative Progress Note  Assessment: Patient is a 73 y.o. female who is currently admitted after undergoing L4/5 ALIF and PSIF   Plan: -Operative plans complete -Needs upright films when able -Drain: none -Out of bed as tolerated, no brace -No bending/lifting/twisting greater than 10 pounds -OT evaluate and treat -Pain control -Diabetic diet -No chemoprophylaxis for dvt or antiplatelets for 72 hours after surgery -Ancef  x2 post-operative doses -Disposition: to floor from PACU  ___________________________________________________________________________   Subjective: No acute events since surgery. Recovering in PACU. Pain well controlled.   Objective:  General: no acute distress, appropriate affect Neurologic: alert, answering questions appropriately, following commands Respiratory: unlabored breathing on room air Skin: dressings clear/dry/intact  MSK (spine):  -Strength exam      Right  Left  EHL    5/5  5/5 TA    5/5  5/5 GSC    5/5  5/5 Knee extension  5/5  5/5 Hip flexion   5/5  5/5  -Sensory exam    Sensation intact to light touch in L3-S1 nerve distributions of bilateral lower extremities   Yesterday's total administered Morphine Milligram Equivalents: 0   Patient name: Rachel Burns Patient MRN: [id number] Date: 11/21/23

## 2023-11-21 NOTE — Brief Op Note (Signed)
 11/21/2023  1:00 PM  PATIENT:  Rachel Burns  73 y.o. female  PRE-OPERATIVE DIAGNOSIS:  LUMBAR RADICULOPATHY  POST-OPERATIVE DIAGNOSIS:  LUMBAR RADICULOPATHY  PROCEDURE:  Procedure(s) with comments: ANTERIOR LATERAL LUMBAR FUSION 1 LEVEL (N/A) POSTERIOR LUMBAR FUSION 1 LEVEL (N/A) - L4-5 ANTERIOR LUMBAR INTERBODY FUSION / POSTERIOR INSTRUMENTED SPINAL FUSION APPLICATION OF O-ARM (N/A) ABDOMINAL EXPOSURE (N/A)  SURGEON:  Surgeons and Role: Panel 1:    DEWAINE Georgina Ozell DELENA, MD - Primary Panel 2:    DEWAINE Gretta Lonni JINNY, MD - Primary  PHYSICIAN ASSISTANT: none  ASSISTANTS: none   ANESTHESIA:  general  EBL:  100 mL   BLOOD ADMINISTERED:none  DRAINS: none   LOCAL MEDICATIONS USED:  MARCAINE      SPECIMEN:  No Specimen  DISPOSITION OF SPECIMEN:  N/A  COUNTS:  YES  TOURNIQUET: NONE  DICTATION: .Note written in EPIC  PLAN OF CARE: Admit to inpatient   PATIENT DISPOSITION:  PACU - hemodynamically stable.   Delay start of Pharmacological VTE agent (>24hrs) due to surgical blood loss or risk of bleeding: yes

## 2023-11-21 NOTE — Op Note (Signed)
 Orthopedic Spine Surgery Operative Report  Procedure: L4/5 posterior lumbar spinal arthrodesis L4, L5 pedicle screw instrumentation with rods Intraoperative use of stereotactic computer-assisted navigation Application of demineralized bone matrix  Modifier: none  Date of procedure: 11/21/2023  Patient name: Rachel Burns MRN: [id number] DOB: 1950/06/21  Surgeon: Ozell Ada, MD Assistant: none Pre-operative diagnosis: lumbar radiculopathy, L4/5 spondylolisthesis Post-operative diagnosis: same as above Findings: L4/5 spondylolisthesis  Specimens: none Anesthesia: general EBL: 50cc Complications: none Both Ancef  and TXA had been given prior to the anterior portion of the procedure and were not scheduled to be redosed  Implants:  NuVasive reline screws 6.5x82mm (x4) Nuvasive pedicle screw set caps (x4) Nuvasive 5.5x71mm titanium rods (x2) Pliafix demineralized bone matrix    Indication for procedure: Patient is a 73 y.o. female who who had been seen in the office for symptoms consistent with lumbar radiculopathy.  The patient had tried conservative treatments but did not notice any lasting relief with those treatments.  As a result, operative management was discussed with the patient.  A L4/5 anterior lumbar interbody fusion with posterior instrumented spinal fusion was discussed with the patient as a treatment option.  The risks, benefits, alternatives of the proposed procedure were covered with the patient.  After this conversation, the patient elected to proceed with surgery.   Procedure Description: At this point, the patient was already in the operating room.  Her surgical sites had already been marked, her consent had been verified, and her identity confirmed.  She was intubated and anesthesia had been induced.  Patient was moved from the pro axis table in the supine position to a stretcher.  The pro axis table was converted to a prone Everman table.  The patient was then  repositioned prone on the prone pro axis table.  All bony prominences were well-padded.  The head of the bed was slightly elevated and the eyes were free from compression by the face pillow. The surgical area was cleansed with alcohol . The patient's skin was then prepped and draped in a standard, sterile fashion. A time out was performed that identified the patient, the procedure, and the operative levels. All team members agreed with what was stated in the time out.   A stab incision was made over the left PSIS.  This incision was taken sharply down through skin and dermis and fascia.  A percutaneous pin was then impacted into place in the PSIS and ilium.  A reference array was attached to the percutaneous pin.  Drapes were applied around the patient.  A green towel was placed over the reference array.  The O-arm was brought in and closed.  The green towel was removed off of the reference array.  An intraoperative CT scan was obtained.  The additional drapes that were placed to cover the patient while the O-arm was then were removed.  The pedicle screws were then placed.  The same steps were repeated for each of the pedicle screws.  The same size pedicle screw was placed at L4 and L5 on both sides.  See the steps and the below paragraph for pedicle screw placement.  The navigational probe was placed over the skin in the area of the pedicle.  Navigation was used to find the skin overlying the pedicle start point.  A stab incision was made and carried sharply down through the skin, dermis, and fascia where the pedicle start point was found with the navigational probe.  The navigated bur was then placed into the wound and  at the pedicle start point.  The bur was advanced into the pedicle under navigational guidance.  A 5.5 mm tap was then placed into the wound and into the previously created track with the bur.  The tap was then advanced into the pedicle and vertebral body under navigational guidance.  The  navigational probe was used to palpate the hole created with the awl.  No breaches were felt within the pedicle.  A 6.5 x 45 mm screw was then inserted under navigational guidance into the pedicle until there was good purchase.  As stated above, this process was repeated for each of the 4 pedicle screws placed.  There were 2 pedicle screws placed at L4 and 2 placed at L5, 1 on each side.  Once the screws were placed, the navigational bur was used to decorticate the L4/5 facet.  The bur was placed into the previously made incision for the L5 pedicle screw.  It was then placed at the L4/5 facet joint under navigational guidance.  The bur was then used under navigational guidance to decorticate the superior and inferior facets at L4/5.  The same process was repeated for the contralateral side to decorticate both L4/5 facet joints.  Next, rods were placed.  The rods were inserted through the previously made wounds and placed into the L4 and L5 pedicle screws.  The same size rods were used on both sides.  Once the rods were contained within the pedicle screws, set caps were advanced over the pedicle screws to secure the rod in place.  The L5 set caps were tightened into place.  Both L4 set caps were left loosely on.  AP and lateral fluoroscopic images confirm satisfactory position of the screws and rods.  The L4 set caps were then simultaneously tightened to try to reduce the spondylolisthesis further.  The L5 set caps were then final tightened bilaterally.  The L4 set caps were then final tightened bilaterally.  The tabs on the percutaneous pedicle screws were then broken off and removed.  The reference array was removed from the percutaneous pin.  The percutaneous pin was back slapped out of the PSIS and ilium.  Final AP and lateral fluoroscopic films were obtained which confirmed satisfactory position of all implants.  Pliafix demineralized bone matrix was then packed into the area of the decorticated L4/5 facet  joints bilaterally.  30 cc of Marcaine  with epinephrine  was injected into the wounds.  Vancomycin  powder was placed into all of the wounds.  The fascial layer was reapproximated with 0 Vicryl suture.  The deep dermal layer was reapproximated with 2-0 Vicryl at all wounds.  The skin was closed with 3-0 Monocryl in all wounds.  All counts were correct at the end of the case.  Dermabond was applied over the skin at all the incision.  Island dressings were placed over the incisions.  The patient was transferred back to a bed and brought to the postanesthesia care unit by anesthesia staff in stable condition.  Post-operative plan: The patient will recover in the post-anesthesia care unit and then go to the floor. The patient will receive two post-operative doses of ancef . She will get another dose of TXA. The patient will be out of bed as tolerated with no brace. The patient will work with physical therapy. The patient will likely discharge to home in the next couple of days.   Ozell Ada, MD Orthopedic Surgeon

## 2023-11-21 NOTE — Discharge Summary (Signed)
 Orthopedic Surgery Discharge Summary  Patient name: Rachel Burns [facility] Patient MRN: [id number] Admit today: 11/21/2023 Discharge date: 11/23/2023  Attending physician: Ozell Ada, MD Final diagnosis: lumbar radiculopathy, L4/5 spondylolisthesis Findings: L4/5 degenerative disc with height loss, L4/5 spondylolisthesis   Hospital course: Patient is a 73 y.o. female who was admitted after undergoing L4/5 anterior lumbar interbody fusion and L4/5 posterior instrumented spinal fusion. The patient had significant pain immediately after surgery that initially requiring IV pain medication, but pain eventually was controlled with a multimodal regimen of oral medication including oxycodone . Labs during the hospitalization revealed no significant electrolyte abnormalities or anemia. The patient worked with physical therapy who recommended discharge to home. The patient was tolerating an oral diet without issue and was voiding spontaneously after surgery. The patient's vitals were stable on the day of discharge. The patient was medically ready for discharge and was discharge to home on post-operative day two.  Instructions:   Orthopedic Surgery Discharge Instructions  Patient name: Rachel Burns [facility] Procedure Performed: L4/5 anterior lumbar interbody fusion, L4/5 posterior instrumented spinal fusion Date of Surgery: 11/21/2023 Surgeon: Ozell Ada, MD  Pre-operative Diagnosis: lumbar radiculopathy, L4/5 spondylolisthesis Post-operative Diagnosis: same as above  Discharged to: home Discharge Condition: stable  Activity: You should refrain from bending, lifting, or twisting with objects greater than ten pounds until three months after surgery. You are encouraged to walk as much as desired. You can perform household activities such as cleaning dishes, doing laundry, vacuuming, etc. as long as the ten-pound restriction is followed. You do not need to wear a brace during the post-operative period.    Incision Care: Your incision sites have dressings over them. These dressings should remain in place and dry at all times for a total of one week after surgery. After one week, you can remove the dressings. Underneath the dressings, you will find skin glue. You should leave the skin glue in place. It will fall off with time. Do not pick, rub, or scrub at it. Do not put cream or lotion over the surgical area. After one week and once the dressing is off, it is okay to let soap and water  run over your incision. Again, do not pick, scrub, or rub at the skin glue when bathing. Do not submerge (e.g., take a bath, swim, go in a hot tub, etc.) until six weeks after surgery. There may be some bloody drainage from the incision into the dressing after surgery. This is normal. You do not need to replace the dressing. Continue to leave it in place for the one week as instructed above. Should the dressing become saturated with blood or drainage, please call the office for further instructions.   Medications: You have been prescribed oxycodone . This is a narcotic pain medication and should only be taken as prescribed. You should not drink alcohol  or operate heavy machinery (including driving) while taking this medication. The oxycodone  can cause constipation as a side effect. For that reason, you have been prescribed senna and miralax . These are both laxatives. You do not need to take this medication if you develop diarrhea. Should you remain constipated even while taking these medications, please increase the dose of miralax  to twice daily. Tylenol  has been prescribed to be taken every 8 hours, which will give you additional pain relief.  Do not take NSAIDs (ibuprofen, Aleve, Celebrex , naproxen, meloxicam, etc.) for the first 6 weeks after surgery as there is some evidence that their use may decrease the chances of successful fusion.  In order to set expectations for opioid prescriptions, you will only be prescribed  opioids for a total of six weeks after surgery and, at two-weeks after surgery, your opioid prescription will start to tapered (decreased dosage and number of pills). If you have ongoing need for opioid medication six weeks after surgery, you will be referred to pain management. If you are already established with a provider that is giving you opioid medications, you should schedule an appointment with them for six weeks after surgery if you feel you are going to need another prescription. State law only allows for opioid prescriptions one week at a time. If you are running out of opioid medication near the end of the week, please call the office during business hours before running out so I can send you another prescription.   You may resume any home blood thinners (warfarin, lovenox, apixaban, plavix, xarelto, etc) 72 hours after your surgery. Take these medications as they were previously prescribed.  Driving: You should not drive while taking narcotic pain medications. You should start getting back to driving slowly and you may want to try driving in a parking lot before doing anything more.   Diet: You are safe to resume your regular diet after surgery.   Reasons to Call the Office After Surgery: You should feel free to call the office with any concerns or questions you have in the post-operative period, but you should definitely notify the office if you develop: -shortness of breath, chest pain, or trouble breathing -excessive bleeding, drainage, redness, or swelling around the surgical site -fevers, chills, or pain that is getting worse with each passing day -persistent nausea or vomiting -new weakness in either leg -new or worsening numbness or tingling in either leg -numbness in the groin, bowel or bladder incontinence -other concerns about your surgery  Follow Up Appointments: You should have an office appointment scheduled for approximately two weeks after surgery. If you do not remember  when this appointment is or do not already have it scheduled, please call the office to schedule.   Office Information:  -Ozell Ada, MD -Phone number: [phone] -Address: [address]       [place], [place] [address]

## 2023-11-21 NOTE — Transfer of Care (Signed)
 Immediate Anesthesia Transfer of Care Note  Patient: Charl Wellen [facility]  Procedure(s) Performed: ANTERIOR LATERAL LUMBAR FUSION 1 LEVEL POSTERIOR LUMBAR FUSION 1 LEVEL APPLICATION OF O-ARM ABDOMINAL EXPOSURE  Patient Location: PACU  Anesthesia Type:General  Level of Consciousness: awake, alert , and sedated  Airway & Oxygen Therapy: Patient Spontanous Breathing and Patient connected to face mask oxygen  Post-op Assessment: Report given to RN and Post -op Vital signs reviewed and stable  Post vital signs: Reviewed and stable  Last Vitals:  Vitals Value Taken Time  BP 135/66 11/21/23 13:00  Temp 36.4 C 11/21/23 12:37  Pulse 81 11/21/23 13:09  Resp 21 11/21/23 13:08  SpO2 91 % 11/21/23 13:09  Vitals shown include unfiled device data.  Last Pain:  Vitals:   11/21/23 1322  TempSrc:   PainSc: 9       Patients Stated Pain Goal: 0 (11/21/23 0650)  Complications: No notable events documented.

## 2023-11-21 NOTE — Anesthesia Procedure Notes (Signed)
 Arterial Line Insertion Start/End9/19/2025 7:15 AM, 11/21/2023 7:20 AM Performed by: Peggye Delon Brunswick, MD, Dorma Jonny BROCKS, CRNA, CRNA  Patient location: Pre-op. Preanesthetic checklist: patient identified, IV checked, site marked, risks and benefits discussed, surgical consent, monitors and equipment checked, pre-op evaluation, timeout performed and anesthesia consent Lidocaine  1% used for infiltration Right, radial was placed Catheter size: 20 G Hand hygiene performed , maximum sterile barriers used  and Seldinger technique used  Attempts: 1 Procedure performed without using ultrasound guided technique. Following insertion, dressing applied and Biopatch. Post procedure assessment: normal and unchanged  Patient tolerated the procedure well with no immediate complications.

## 2023-11-21 NOTE — H&P (Addendum)
 Patient name: Rachel Burns     MRN: [id number]        DOB: 1950-04-20          Sex: female   REASON FOR CONSULT: Anterior spine exposure for L4-L5 ALIF   HPI: Rachel Burns is a 73 y.o. female, with history of diabetes, hypertension, fatty liver presents for evaluation of anterior spine exposure for L4-L5 ALIF.  She has chronic lower back pain.  She is under the care of Dr. Georgina.  Previously had a lumbar laminectomy with bilateral foraminotomies 11/25/2022.  Now with evidence of L4-L5 spondylolisthesis with bilateral foraminal stenosis.  Did have an MRI of her lumbar spine on 08/14/2023.   Previous abdominal surgery includes C-section and cholecystectomy.       Past Medical History:  Diagnosis Date   DDD (degenerative disc disease), lumbar     Diabetes mellitus without complication (HCC)     Fatty liver     GERD (gastroesophageal reflux disease)     Hypertension                 Past Surgical History:  Procedure Laterality Date   ARM WOUND REPAIR / CLOSURE Right      arm was broken in 3 places   CESAREAN SECTION       CHOLECYSTECTOMY       DECOMPRESSIVE LUMBAR LAMINECTOMY LEVEL 1 N/A 11/25/2022    Procedure: L4-5 LUMBAR LAMINECTOMY WITH BILATERAL FORAMINOTOMIES;  Surgeon: Georgina Ozell LABOR, MD;  Location: [facility] OR;  Service: Orthopedics;  Laterality: N/A;   DILATION AND CURETTAGE OF UTERUS              History reviewed. No pertinent family history.       SOCIAL HISTORY: Social History         Socioeconomic History   Marital status: Married      Spouse name: Not on file   Number of children: 2   Years of education: Not on file   Highest education level: Not on file  Occupational History   Not on file  Tobacco Use   Smoking status: Never   Smokeless tobacco: Never  Vaping Use   Vaping status: Never Used  Substance and Sexual Activity   Alcohol  use: Yes      Comment: maybe 1 drink twice a month   Drug use: Not Currently   Sexual activity: Not on  file  Other Topics Concern   Not on file  Social History Narrative   Not on file    Social Drivers of Health    Financial Resource Strain: Not on file  Food Insecurity: Not on file  Transportation Needs: Not on file  Physical Activity: Not on file  Stress: Not on file  Social Connections: Not on file  Intimate Partner Violence: Not on file      Allergies      Allergies  Allergen Reactions   Penicillins Rash              Current Outpatient Medications  Medication Sig Dispense Refill   azelastine  (ASTELIN ) 0.1 % nasal spray Place 2 sprays into both nostrils 2 (two) times daily as needed for rhinitis. Use in each nostril as directed       brimonidine  (ALPHAGAN  P) 0.1 % SOLN Apply 1 drop to eye 2 (two) times daily.       celecoxib  (CELEBREX ) 200 MG capsule Take 1 capsule (200 mg total) by mouth daily. 30 capsule 0  methocarbamol  (ROBAXIN ) 500 MG tablet TAKE 1 TABLET BY MOUTH 4 TIMES DAILY FOR 7 DAYS. 28 tablet 0   Multiple Vitamin (MULTIVITAMIN WITH MINERALS) TABS tablet Take 1 tablet by mouth daily.       Omega-3 Fatty Acids (FISH OIL PO) Take 1 capsule by mouth daily.       omeprazole (PRILOSEC) 40 MG capsule Take 40 mg by mouth daily.       pregabalin  (LYRICA ) 100 MG capsule TAKE 1 CAPSULE BY MOUTH TWICE A DAY 60 capsule 1   pregabalin  (LYRICA ) 50 MG capsule TAKE 1 CAPSULE BY MOUTH 2 TIMES DAILY. 60 capsule 0   Semaglutide, 1 MG/DOSE, (OZEMPIC, 1 MG/DOSE,) 4 MG/3ML SOPN Inject 1 mg into the skin once a week.       triamterene -hydrochlorothiazide  (MAXZIDE -25) 37.5-25 MG tablet Take 1 tablet by mouth daily.       VITAMIN A PO Take 1 tablet by mouth daily.       VYZULTA  0.024 % SOLN Place 1 drop into both eyes at bedtime.          No current facility-administered medications for this visit.        REVIEW OF SYSTEMS:  [X]  denotes positive finding, [ ]  denotes negative finding Cardiac   Comments:  Chest pain or chest pressure:      Shortness of breath upon exertion:       Short of breath when lying flat:      Irregular heart rhythm:             Vascular      Pain in calf, thigh, or hip brought on by ambulation:      Pain in feet at night that wakes you up from your sleep:       Blood clot in your veins:      Leg swelling:              Pulmonary      Oxygen at home:      Productive cough:       Wheezing:              Neurologic      Sudden weakness in arms or legs:       Sudden numbness in arms or legs:       Sudden onset of difficulty speaking or slurred speech:      Temporary loss of vision in one eye:       Problems with dizziness:              Gastrointestinal      Blood in stool:       Vomited blood:              Genitourinary      Burning when urinating:       Blood in urine:             Psychiatric      Major depression:              Hematologic      Bleeding problems:      Problems with blood clotting too easily:             Skin      Rashes or ulcers:             Constitutional      Fever or chills:          PHYSICAL EXAM:    Vitals:    09/09/23  1123  BP: 132/74  Pulse: 95  Resp: 18  Temp: 97.9 F (36.6 C)  TempSrc: Temporal  SpO2: 90%  Weight: 182 lb 11.2 oz (82.9 kg)  Height: 5' 2 (1.575 m)      GENERAL: The patient is a well-nourished female, in no acute distress. The vital signs are documented above. CARDIAC: There is a regular rate and rhythm.  VASCULAR:  Palpable bilateral femoral pulses Palpable bilateral DP pulses PULMONARY: No respiratory distress. ABDOMEN: Soft and non-tender. MUSCULOSKELETAL: There are no major deformities or cyanosis. NEUROLOGIC: No focal weakness or paresthesias are detected. SKIN: There are no ulcers or rashes noted. PSYCHIATRIC: The patient has a normal affect.   DATA:    MRI reviewed from 08/14/23      Assessment/Plan:   73 y.o. female, with history of diabetes, hypertension, fatty liver presents for evaluation of anterior spine exposure for L4-L5 ALIF.  She has  chronic lower back pain.  She is under the care of Dr. Georgina.  Previously had a lumbar laminectomy with bilateral foraminotomies 11/25/2022.  Now with evidence of L4-L5 spondylolisthesis with bilateral foraminal stenosis.   I have reviewed her MRI and discussed she would be a good candidate for anterior approach.  Discussed paramedian incision over the left rectus muscle.  I discussed entering into the rectus sheath and mobilizing the left rectus muscle to the midline.  Discussed mobilizing peritoneum ane left ureter across midline.  Discussed mobilizing iliac artery and vein.  All this to get the disc space exposed from the front.  Discussed risk of injury to the above structures including vascular injury.  Look forward to assisting Dr. Georgina.     Lonni DOROTHA Gaskins, MD [facility] Office: [phone]

## 2023-11-21 NOTE — Op Note (Addendum)
 Orthopedic Spine Surgery Operative Report  Procedure: L4/5 anterior lumbar spinal fusion L4/5 insertion of biomechanical interbody device Application of demineralized bone matrix  Modifier: 62  Date of procedure: 11/21/2023  Patient name: Rachel Burns MRN: [id number] DOB: 1950-09-20  Surgeon: Ozell Ada, MD Co-Surgeon: Lonni Gaskins, MD (vascular surgery) Assistant: none Pre-operative diagnosis: lumbar radiculopathy, L4/5 spondylolisthesis Post-operative diagnosis: same as above Findings: L4/5 degenerative disc with height loss, L4/5 spondylolisthesis  Specimens: none Anesthesia: general EBL: 50cc Complications: none Pre-incision antibiotic: ancef  TXA was also given prior to incision  Implants:  Globus Hedron 13x26x34, 8 degree 25mm integrated anchors within the cage x3 Pliafix demineralized bone matrix   Indication for procedure: Patient is a 73 y.o. female who presented to the office with symptoms consistent with lumbar radiculopathy. The patient had tried conservative treatments that did not provide any lasting relief. As result, operative management was discussed. In order to address the symptoms, anterior lumbar interbody arthrodesis at L4/5 was discussed as a part of one of the treatment options. The risks including but not limited to dural tear, nerve root injury, paralysis, persistent pain, infection, bleeding, hardware failure, adjacent segment disease, vascular injury, bowel injury, heart attack, death, stroke, fracture, and need for additional procedures were discussed with the patient. Since this will be an indirect decompression, there is a risk of persistent symptoms which the patient understood after explaining the reason. The benefit of the surgery would be improvement in the patient's radiating leg pain. The alternatives to surgical management were covered with the patient and included continued monitoring, physical therapy, over-the-counter pain  medications, ambulatory aids, repeat injections, and activity modification. All the patient's questions were answered to her satisfaction. After this discussion, the patient expressed understanding and elected to proceed with surgical intervention.    Procedure Description: The patient was met in the pre-operative holding area. The patient's identity and consent were verified. The operative site was marked. The patient's remaining questions about the surgery were answered. The patient was brought back to the operating room. General anesthesia was induced and an endotracheal tube was placed by the anesthesia staff. The patient was transferred to the proaxis table in the supine position. A padded bump was placed beneath the cranial sacrum. All bony prominences were well padded. An electric razor was used to remove her hair over the lower abdominal wall and superior pubic region. The surgical area was cleansed with alcohol . Fluoroscopy was then brought in to check rotation on the AP image and to mark the levels on the lateral image. The patient's skin was then prepped and draped in a standard, sterile fashion. A time out was performed that identified the patient, the procedure, and the operative levels. All team members agreed with what was stated in the time out. Dr. Gaskins (approach surgeon) took over at this time to provide exposure to the anterior lumbar spine (dictated separately).   Once adequate exposure was obtained and the vessels had been adequately retracted away from the disc space, a bayonetted needle was placed into the disc space. An AP image was obtained to determine the needle's position relative to midline within the disc space. A lateral fluoroscopic image was then obtained to verify that the correct level had been exposed. Electrocautery was used to mark the midline of the disc. The needle was removed and a trial interbody was then centered over the midline mark. The lateral edges of the  interbody device were marked with electrocautery. A long handle knife was used to incise the  disc along the L4 and L5 endplates. A vertical incision in the disc was made where the lateral edges of the interbody had been marked to complete the rectangular incision in the disc. A Cobb elevator was placed into the transverse incision paralleling the endplate and advanced under lateral fluoroscopic imaging to the posterior aspect of the disc space. The Cobb was used to repeat this step on the other end plate. A pituitary was used to remove a significant portion of the disc. A combination of straight and angled curettes were used to remove the disc and cartilaginous material off of the endplates. Periodic lateral fluoroscopic images were taken to confirm that the curettes did not go posterior to the vertebral bodies. A combination of pituitaries and Kerrison rongeurs were used to remove the remaining disc and cartilaginous material. A series of trials were inserted until a good fit was obtained. This occurred with a 13mm, medium footprint. This was selected as the final interbody device which was then packed with demineralized bone matrix on the back table. The final interbody device was then inserted into the interspace and impacted into place under lateral fluoroscopic imaging. Once there was a good fit and satisfactory position on the lateral fluoroscopic image, an AP image was taken showing satisfactory position. Anchors were then impacted into place through the cage and into the L4 and L5 vertebral bodies.   Final AP and lateral fluoroscopic films were taken and confirmed satisfactory position of all implants. The wound was then closed in layers. The fascia overlying the rectus was reapproximated with 0 Vicryl.  The subcutaneous fat was reapproximated with 0 Vicryl.  The deep dermal layer was reapproximated with 2-0 Vicryl.  The skin was closed with a running 3-0 Monocryl.  Dermabond was applied over the skin.   All counts were correct at the end of the case.  Post-operative plan: The patient will remain intubated and in the operating room after closure. The drapes will be taken down and the patient repositioned in the prone position. The posterior portion of the case will be completed next in the same trip to the operating room and will be dictated separately.    Ozell Ada, MD Orthopedic Surgeon

## 2023-11-21 NOTE — Discharge Instructions (Addendum)
 Orthopedic Surgery Discharge Instructions  Patient name: Rachel Burns [facility] Procedure Performed: L4/5 anterior lumbar interbody fusion, L4/5 posterior instrumented spinal fusion Date of Surgery: 11/21/2023 Surgeon: Ozell Ada, MD  Pre-operative Diagnosis: lumbar radiculopathy, L4/5 spondylolisthesis Post-operative Diagnosis: same as above  Discharged to: home Discharge Condition: stable  Activity: You should refrain from bending, lifting, or twisting with objects greater than ten pounds until three months after surgery. You are encouraged to walk as much as desired. You can perform household activities such as cleaning dishes, doing laundry, vacuuming, etc. as long as the ten-pound restriction is followed. You do not need to wear a brace during the post-operative period.   Incision Care: Your incision sites have dressings over them. These dressings should remain in place and dry at all times for a total of one week after surgery. After one week, you can remove the dressings. Underneath the dressings, you will find skin glue. You should leave the skin glue in place. It will fall off with time. Do not pick, rub, or scrub at it. Do not put cream or lotion over the surgical area. After one week and once the dressing is off, it is okay to let soap and water  run over your incision. Again, do not pick, scrub, or rub at the skin glue when bathing. Do not submerge (e.g., take a bath, swim, go in a hot tub, etc.) until six weeks after surgery. There may be some bloody drainage from the incision into the dressing after surgery. This is normal. You do not need to replace the dressing. Continue to leave it in place for the one week as instructed above. Should the dressing become saturated with blood or drainage, please call the office for further instructions.   Medications: You have been prescribed oxycodone . This is a narcotic pain medication and should only be taken as prescribed. You should not drink  alcohol  or operate heavy machinery (including driving) while taking this medication. The oxycodone  can cause constipation as a side effect. For that reason, you have been prescribed senna and miralax . These are both laxatives. You do not need to take this medication if you develop diarrhea. Should you remain constipated even while taking these medications, please increase the dose of miralax  to twice daily. Tylenol  has been prescribed to be taken every 8 hours, which will give you additional pain relief.  Do not take NSAIDs (ibuprofen, Aleve, Celebrex , naproxen, meloxicam, etc.) for the first 6 weeks after surgery as there is some evidence that their use may decrease the chances of successful fusion.   In order to set expectations for opioid prescriptions, you will only be prescribed opioids for a total of six weeks after surgery and, at two-weeks after surgery, your opioid prescription will start to tapered (decreased dosage and number of pills). If you have ongoing need for opioid medication six weeks after surgery, you will be referred to pain management. If you are already established with a provider that is giving you opioid medications, you should schedule an appointment with them for six weeks after surgery if you feel you are going to need another prescription. State law only allows for opioid prescriptions one week at a time. If you are running out of opioid medication near the end of the week, please call the office during business hours before running out so I can send you another prescription.   You may resume any home blood thinners (warfarin, lovenox, apixaban, plavix, xarelto, etc) 72 hours after your surgery. Take these medications  as they were previously prescribed.  Driving: You should not drive while taking narcotic pain medications. You should start getting back to driving slowly and you may want to try driving in a parking lot before doing anything more.   Diet: You are safe to resume  your regular diet after surgery.   Reasons to Call the Office After Surgery: You should feel free to call the office with any concerns or questions you have in the post-operative period, but you should definitely notify the office if you develop: -shortness of breath, chest pain, or trouble breathing -excessive bleeding, drainage, redness, or swelling around the surgical site -fevers, chills, or pain that is getting worse with each passing day -persistent nausea or vomiting -new weakness in either leg -new or worsening numbness or tingling in either leg -numbness in the groin, bowel or bladder incontinence -other concerns about your surgery  Follow Up Appointments: You should have an office appointment scheduled for approximately two weeks after surgery. If you do not remember when this appointment is or do not already have it scheduled, please call the office to schedule.   Office Information:  -Ozell Ada, MD -Phone number: [phone] -Address: [address]       [place], [place] [address]

## 2023-11-22 LAB — BASIC METABOLIC PANEL WITH GFR
Anion gap: 13 (ref 5–15)
BUN: 10 mg/dL (ref 8–23)
CO2: 22 mmol/L (ref 22–32)
Calcium: 8 mg/dL — ABNORMAL LOW (ref 8.9–10.3)
Chloride: 101 mmol/L (ref 98–111)
Creatinine, Ser: 0.74 mg/dL (ref 0.44–1.00)
GFR, Estimated: >60 mL/min (ref >60)
Glucose, Bld: 162 mg/dL — ABNORMAL HIGH (ref 70–99)
Potassium: 3.1 mmol/L — ABNORMAL LOW (ref 3.5–5.1)
Sodium: 136 mmol/L (ref 135–145)

## 2023-11-22 LAB — CBC
HCT: 36.5 % (ref 36.0–46.0)
Hemoglobin: 12 g/dL (ref 12.0–15.0)
MCH: 31.4 pg (ref 26.0–34.0)
MCHC: 32.9 g/dL (ref 30.0–36.0)
MCV: 95.5 fL (ref 80.0–100.0)
Platelets: 246 K/uL (ref 150–400)
RBC: 3.82 MIL/uL — ABNORMAL LOW (ref 3.87–5.11)
RDW: 12.7 % (ref 11.5–15.5)
WBC: 10 K/uL (ref 4.0–10.5)
nRBC: 0 % (ref 0.0–0.2)

## 2023-11-22 LAB — GLUCOSE, CAPILLARY
Glucose-Capillary: 169 mg/dL — ABNORMAL HIGH (ref 70–99)
Glucose-Capillary: 205 mg/dL — ABNORMAL HIGH (ref 70–99)
Glucose-Capillary: 205 mg/dL — ABNORMAL HIGH (ref 70–99)
Glucose-Capillary: 220 mg/dL — ABNORMAL HIGH (ref 70–99)

## 2023-11-22 MED ORDER — HYDROMORPHONE HCL 1 MG/ML IJ SOLN
1.0000 mg | INTRAMUSCULAR | Status: AC | PRN
  Administered 2023-11-22: 1 mg via INTRAVENOUS
  Filled 2023-11-22 (×2): qty 1

## 2023-11-22 NOTE — Evaluation (Signed)
 Occupational Therapy Evaluation Patient Details Name: Rachel Burns MRN: [id number] DOB: 21-May-1950 Today's Date: 11/22/2023   History of Present Illness   Pt is a 73 y/o F s/p L4-5 ALIF and PSIF. PMH includes HTN, GERD, diabetes, chronic pain, L4-5 laminectomy     Clinical Impressions Pt ind at baseline with ADLs/ at times using cane for mobility, lives with spouse. Pt currently needing up to mod A for ADLs, CGA for bed mobility and transfers. Pt more steady with RW for ambulation, educated on back precautions and compensatory strategies for ADLs (handout also provided). Pt presenting with impairments listed below, will follow acutely. Recommend HHOT at d/c pending progression.     If plan is discharge home, recommend the following:   A little help with walking and/or transfers;A little help with bathing/dressing/bathroom;Assistance with cooking/housework;Assist for transportation;Help with stairs or ramp for entrance     Functional Status Assessment   Patient has had a recent decline in their functional status and demonstrates the ability to make significant improvements in function in a reasonable and predictable amount of time.     Equipment Recommendations   Tub/shower seat     Recommendations for Other Services   PT consult     Precautions/Restrictions   Precautions Precautions: Fall;Back Precaution Booklet Issued: Yes (comment) Recall of Precautions/Restrictions: Intact Precaution/Restrictions Comments: educated on 3/3 back prec Required Braces or Orthoses: Other Brace Other Brace: no brace needed per orders Restrictions Weight Bearing Restrictions Per Provider Order: No     Mobility Bed Mobility Overal bed mobility: Needs Assistance Bed Mobility: Sidelying to Sit   Sidelying to sit: Contact guard assist       General bed mobility comments: via log roll    Transfers Overall transfer level: Needs assistance Equipment used: Rolling walker  (2 wheels) Transfers: Sit to/from Stand Sit to Stand: Contact guard assist                  Balance Overall balance assessment: Needs assistance Sitting-balance support: Feet supported Sitting balance-Leahy Scale: Good     Standing balance support: During functional activity, Reliant on assistive device for balance Standing balance-Leahy Scale: Fair Standing balance comment: static standing without AD                           ADL either performed or assessed with clinical judgement   ADL Overall ADL's : Needs assistance/impaired Eating/Feeding: Set up   Grooming: Set up   Upper Body Bathing: Minimal assistance   Lower Body Bathing: Moderate assistance   Upper Body Dressing : Minimal assistance   Lower Body Dressing: Moderate assistance   Toilet Transfer: Contact guard assist;Ambulation;Rolling walker (2 wheels)   Toileting- Clothing Manipulation and Hygiene: Contact guard assist       Functional mobility during ADLs: Contact guard assist;Rolling walker (2 wheels)       Vision   Vision Assessment?: No apparent visual deficits     Perception Perception: Not tested       Praxis Praxis: Not tested       Pertinent Vitals/Pain Pain Assessment Pain Assessment: Faces Pain Score: 5  Faces Pain Scale: Hurts little more Pain Location: back Pain Descriptors / Indicators: Discomfort Pain Intervention(s): Limited activity within patient's tolerance, Monitored during session, Repositioned     Extremity/Trunk Assessment Upper Extremity Assessment Upper Extremity Assessment: Generalized weakness   Lower Extremity Assessment Lower Extremity Assessment: Generalized weakness   Cervical / Trunk Assessment Cervical / Trunk  Assessment: Back Surgery   Communication Communication Communication: No apparent difficulties   Cognition Arousal: Alert Behavior During Therapy: WFL for tasks assessed/performed Cognition: No apparent impairments                                Following commands: Intact       Cueing  General Comments   Cueing Techniques: Verbal cues  VSS on RA   Exercises     Shoulder Instructions      Home Living Family/patient expects to be discharged to:: Private residence Living Arrangements: Spouse/significant other Available Help at Discharge: Family;Available PRN/intermittently Type of Home: House Home Access: Stairs to enter [employer] of Steps: 3 Entrance Stairs-Rails: None Home Layout: Two level;Bed/bath upstairs;Able to live on main level with bedroom/bathroom;1/2 bath on main level     Bathroom Shower/Tub: Tub/shower unit;Walk-in shower   Bathroom Toilet: Standard     Home Equipment: Agricultural consultant (2 wheels);Cane - single point;Adaptive equipment Adaptive Equipment: Reacher Additional Comments: recliner downstairs      Prior Functioning/Environment Prior Level of Function : Independent/Modified Independent             Mobility Comments: occasional use of cane ADLs Comments: ind, drives    OT Problem List: Decreased strength;Decreased range of motion;Decreased activity tolerance;Impaired balance (sitting and/or standing);Decreased safety awareness;Decreased knowledge of precautions   OT Treatment/Interventions: Self-care/ADL training;Therapeutic exercise;Energy conservation;DME and/or AE instruction;Therapeutic activities;Patient/family education;Balance training      OT Goals(Current goals can be found in the care plan section)   Acute Rehab OT Goals Patient Stated Goal: none stated OT Goal Formulation: With patient Time For Goal Achievement: 12/06/23 Potential to Achieve Goals: Good ADL Goals Pt Will Perform Upper Body Dressing: with supervision;sitting Pt Will Perform Lower Body Dressing: with supervision;sit to/from stand;sitting/lateral leans;with adaptive equipment Pt Will Transfer to Toilet: with supervision;ambulating;regular height toilet Pt  Will Perform Tub/Shower Transfer: Tub transfer;Shower transfer;with supervision;ambulating;rolling walker   OT Frequency:  Min 2X/week    Co-evaluation              AM-PAC OT 6 Clicks Daily Activity     Outcome Measure Help from another person eating meals?: None Help from another person taking care of personal grooming?: None Help from another person toileting, which includes using toliet, bedpan, or urinal?: A Little Help from another person bathing (including washing, rinsing, drying)?: A Lot Help from another person to put on and taking off regular upper body clothing?: A Little Help from another person to put on and taking off regular lower body clothing?: A Lot 6 Click Score: 18   End of Session Equipment Utilized During Treatment: Rolling walker (2 wheels) Nurse Communication: Mobility status  Activity Tolerance: Patient tolerated treatment well Patient left: in chair;with call bell/phone within reach (pt aware to call if needing to get out of chair)  OT Visit Diagnosis: Unsteadiness on feet (R26.81);Other abnormalities of gait and mobility (R26.89);Muscle weakness (generalized) (M62.81)                Time: 9198-9168 OT Time Calculation (min): 30 min Charges:  OT General Charges $OT Visit: 1 Visit OT Evaluation $OT Eval Low Complexity: 1 Low OT Treatments $Self Care/Home Management : 8-22 mins  Lajuanna Pompa K, OTD, OTR/L SecureChat Preferred Acute Rehab [phone]   Laneta POUR Koonce 11/22/2023, 8:41 AM

## 2023-11-22 NOTE — Evaluation (Signed)
 Physical Therapy Evaluation Patient Details Name: Rachel Burns MRN: [id number] DOB: 09/06/1950 Today's Date: 11/22/2023  History of Present Illness  Pt is a 73 y/o F s/p L4-5 ALIF and PSIF. PMH includes HTN, GERD, diabetes, chronic pain, L4-5 laminectomy  Clinical Impression  PTA pt was independent for mobility with no AD or SP cane. Pt was able to recall 3/3 spinal precautions with good adherence during session. Educated on walking program, activity modification, and energy conservation techniques. Pt was able to stand and ambulate 120 ft with CGA and RW. Further activity limited as pt began to feel nauseous. Pt has 24/7 assist available upon d/c home. Recommending HH PT with intended progression to OP PT to address functional impairments and work towards independence with mobility. Pt presenting with impairments listed below, will follow acutely.        If plan is discharge home, recommend the following: A little help with walking and/or transfers;Assist for transportation;Help with stairs or ramp for entrance   Can travel by private vehicle    Yes    Equipment Recommendations None recommended by PT     Functional Status Assessment Patient has had a recent decline in their functional status and demonstrates the ability to make significant improvements in function in a reasonable and predictable amount of time.     Precautions / Restrictions Precautions Precautions: Fall;Back Precaution Booklet Issued: Yes (comment) Recall of Precautions/Restrictions: Intact Precaution/Restrictions Comments: Able to recall 3/3 precautions Required Braces or Orthoses: Other Brace Other Brace: no brace needed per orders Restrictions Weight Bearing Restrictions Per Provider Order: No      Mobility  Bed Mobility Overal bed mobility: Needs Assistance Bed Mobility: Sidelying to Sit, Rolling Rolling: Contact guard assist Sidelying to sit: Min assist     General bed mobility comments: cues  for log roll technique, MinA to assist with trunk raise    Transfers Overall transfer level: Needs assistance Equipment used: Rolling walker (2 wheels) Transfers: Sit to/from Stand Sit to Stand: Contact guard assist    General transfer comment: for safety, cues for hand placement    Ambulation/Gait Ambulation/Gait assistance: Contact guard assist Gait Distance (Feet): 120 Feet Assistive device: Rolling walker (2 wheels) Gait Pattern/deviations: Step-through pattern, Decreased stride length Gait velocity: decr     General Gait Details: slow and steady gait with no overt LOB    Balance Overall balance assessment: Needs assistance Sitting-balance support: Feet supported Sitting balance-Leahy Scale: Good     Standing balance support: During functional activity, Reliant on assistive device for balance Standing balance-Leahy Scale: Fair Standing balance comment: static standing without AD       Pertinent Vitals/Pain Pain Assessment Pain Assessment: Faces Faces Pain Scale: Hurts even more Pain Location: back Pain Descriptors / Indicators: Discomfort Pain Intervention(s): Limited activity within patient's tolerance, Monitored during session, Repositioned    Home Living Family/patient expects to be discharged to:: Private residence Living Arrangements: Spouse/significant other Available Help at Discharge: Family;Available PRN/intermittently Type of Home: House Home Access: Stairs to enter Entrance Stairs-Rails: None Entrance Stairs-Number of Steps: 3   Home Layout: Two level;Bed/bath upstairs;Able to live on main level with bedroom/bathroom;1/2 bath on main level Home Equipment: Rolling Walker (2 wheels);Cane - single point;Adaptive equipment Additional Comments: recliner downstairs    Prior Function Prior Level of Function : Independent/Modified Independent    Mobility Comments: occasional use of cane ADLs Comments: ind, drives     Extremity/Trunk Assessment    Upper Extremity Assessment Upper Extremity Assessment: Defer to OT evaluation  Lower Extremity Assessment Lower Extremity Assessment: Generalized weakness (decreased sensation in R LE, alert to light touch)    Cervical / Trunk Assessment Cervical / Trunk Assessment: Back Surgery  Communication   Communication Communication: No apparent difficulties    Cognition Arousal: Alert Behavior During Therapy: WFL for tasks assessed/performed   PT - Cognitive impairments: No apparent impairments     Following commands: Intact       Cueing Cueing Techniques: Verbal cues     General Comments General comments (skin integrity, edema, etc.): VSS on RA     PT Assessment Patient needs continued PT services  PT Problem List Decreased strength;Decreased activity tolerance;Decreased balance;Decreased mobility       PT Treatment Interventions DME instruction;Gait training;Stair training;Functional mobility training;Therapeutic activities;Therapeutic exercise;Balance training;Neuromuscular re-education;Patient/family education    PT Goals (Current goals can be found in the Care Plan section)  Acute Rehab PT Goals Patient Stated Goal: to gain strength PT Goal Formulation: With patient/family Time For Goal Achievement: 12/06/23 Potential to Achieve Goals: Good    Frequency Min 5X/week        AM-PAC PT 6 Clicks Mobility  Outcome Measure Help needed turning from your back to your side while in a flat bed without using bedrails?: None Help needed moving from lying on your back to sitting on the side of a flat bed without using bedrails?: A Little Help needed moving to and from a bed to a chair (including a wheelchair)?: A Little Help needed standing up from a chair using your arms (e.g., wheelchair or bedside chair)?: A Little Help needed to walk in hospital room?: A Little Help needed climbing 3-5 steps with a railing? : A Little 6 Click Score: 19    End of Session   Activity  Tolerance: Patient tolerated treatment well Patient left: in chair;with call bell/phone within reach;with family/visitor present Nurse Communication: Mobility status PT Visit Diagnosis: Other abnormalities of gait and mobility (R26.89);Muscle weakness (generalized) (M62.81)    Time: 8764-8746 PT Time Calculation (min) (ACUTE ONLY): 18 min   Charges:   PT Evaluation $PT Eval Low Complexity: 1 Low   PT General Charges $$ ACUTE PT VISIT: 1 Visit       Kate ORN, PT, DPT Secure Chat Preferred  Rehab Office [phone]   Kate BRAVO Wendolyn 11/22/2023, 1:46 PM

## 2023-11-22 NOTE — Progress Notes (Addendum)
  Progress Note    11/22/2023 6:53 AM 1 Day Post-Op  Subjective:  feels better than yesterday  afebrile  Vitals:   11/21/23 2347 11/22/23 0542  BP: (!) 108/41 131/61  Pulse: 96 99  Resp: 15 18  Temp: 98.2 F (36.8 C) 98.1 F (36.7 C)  SpO2: 95% 100%    Physical Exam: General:  no distress Cardiac:  regular Lungs:   non labored Incisions:  abdominal dressing is clean and intact Extremities:  palpable DP pulses bilaterally Abdomen:  soft  CBC    Component Value Date/Time   WBC 10.0 11/22/2023 0242   RBC 3.82 (L) 11/22/2023 0242   HGB 12.0 11/22/2023 0242   HCT 36.5 11/22/2023 0242   PLT 246 11/22/2023 0242   MCV 95.5 11/22/2023 0242   MCH 31.4 11/22/2023 0242   MCHC 32.9 11/22/2023 0242   RDW 12.7 11/22/2023 0242    BMET    Component Value Date/Time   NA 136 11/22/2023 0242   K 3.1 (L) 11/22/2023 0242   CL 101 11/22/2023 0242   CO2 22 11/22/2023 0242   GLUCOSE 162 (H) 11/22/2023 0242   BUN 10 11/22/2023 0242   CREATININE 0.74 11/22/2023 0242   CALCIUM 8.0 (L) 11/22/2023 0242   GFRNONAA >60 11/22/2023 0242    INR No results found for: INR   Intake/Output Summary (Last 24 hours) at 11/22/2023 0653 Last data filed at 11/21/2023 1324 Gross per 24 hour  Intake 1863.38 ml  Output 350 ml  Net 1513.38 ml      Assessment/Plan:  73 y.o. female is s/p:  Anterior retroperitoneal spine exposure for L4-L5 ALIF 11/21/2023 by Dr. Gretta  1 Day Post-Op   -doing well from vascular standpoint this morning. She has palpable DP pulses bilaterally and dressing in tact. -f/u with vascular surgery as needed -please call with questions.    Lucie Apt, PA-C [facility] [phone] 11/22/2023 6:53 AM   I have interviewed and examined patient with PA and agree with assessment and plan above.   Anis Cinelli C. Sheree, MD [facility] [place] Office: [phone] Pager: [phone]

## 2023-11-22 NOTE — Progress Notes (Signed)
 Orthopedic Surgery Post-operative Progress Note  Assessment: Patient is a 73 y.o. female who is currently admitted after undergoing L4/5 ALIF and PSIF   Plan: -Operative plans complete -Needs upright films when able -Drain: none -Out of bed as tolerated, no brace -No bending/lifting/twisting greater than 10 pounds -OT evaluate and treat -Pain control -Diabetic diet -No chemoprophylaxis for dvt or antiplatelets for 72 hours after surgery -Ancef  x2 post-operative doses -Disposition: remain floor status  ___________________________________________________________________________   Subjective: No acute events overnight. Did have some nausea last night and could not eat much. Nausea has improved this morning. Was able to eat some of her breakfast. Having back pain. Abdominal pain not as bad. No radiating leg pain. Has not mobilized yet. Pain is severe enough that it is requiring IV medication to control.    Objective:  General: no acute distress, appropriate affect Neurologic: alert, answering questions appropriately, following commands Respiratory: unlabored breathing on room air Skin: dressings clear/dry/intact  MSK (spine):  -Strength exam      Right  Left  EHL    5/5  5/5 TA    5/5  5/5 GSC    5/5  5/5 Knee extension  5/5  5/5 Hip flexion   5/5  5/5  -Sensory exam    Sensation intact to light touch in L3-S1 nerve distributions of bilateral lower extremities   Yesterday's total administered Morphine Milligram Equivalents: 130   Patient name: Rachel Burns [facility] Patient MRN: [id number] Date: 11/22/23

## 2023-11-23 ENCOUNTER — Inpatient Hospital Stay (HOSPITAL_COMMUNITY)

## 2023-11-23 LAB — GLUCOSE, CAPILLARY
Glucose-Capillary: 162 mg/dL — ABNORMAL HIGH (ref 70–99)
Glucose-Capillary: 185 mg/dL — ABNORMAL HIGH (ref 70–99)

## 2023-11-23 MED ORDER — ACETAMINOPHEN 500 MG PO TABS: 1000.0000 mg | ORAL_TABLET | Freq: Three times a day (TID) | ORAL | 0 refills | Status: AC

## 2023-11-23 MED ORDER — SENNA 8.6 MG PO TABS: 1.0000 | ORAL_TABLET | Freq: Two times a day (BID) | ORAL | 0 refills | Status: AC

## 2023-11-23 MED ORDER — OXYCODONE HCL 5 MG PO TABS: 5.0000 mg | ORAL_TABLET | ORAL | 0 refills | Status: DC | PRN

## 2023-11-23 MED ORDER — POLYETHYLENE GLYCOL 3350 17 G PO PACK: 17.0000 g | PACK | Freq: Every day | ORAL | 0 refills | Status: AC

## 2023-11-23 NOTE — Progress Notes (Signed)
 Orthopedic Surgery Post-operative Progress Note  Assessment: Patient is a 73 y.o. female who is currently admitted after undergoing L4/5 ALIF and PSIF   Plan: -Operative plans complete -Drain: none -Out of bed as tolerated, no brace -No bending/lifting/twisting greater than 10 pounds -OT evaluate and treat -Pain control -Diabetic diet -No chemoprophylaxis for dvt or antiplatelets for 72 hours after surgery -Anticipate discharge to home today  ___________________________________________________________________________   Subjective: No acute events overnight. Pain well controlled with oral medication today. Required IV medication yesterday. No radiating leg pain including when working with PT yesterday.    Objective:  General: no acute distress, appropriate affect Neurologic: alert, answering questions appropriately, following commands Respiratory: unlabored breathing on room air Skin: dressings clear/dry/intact  MSK (spine):  -Strength exam      Right  Left  EHL    5/5  5/5 TA    5/5  5/5 GSC    5/5  5/5 Knee extension  5/5  5/5 Hip flexion   5/5  5/5  -Sensory exam    Sensation intact to light touch in L3-S1 nerve distributions of bilateral lower extremities   Yesterday's total administered Morphine Milligram Equivalents: 70   Patient name: [facility] Patient MRN: [id number] Date: 11/23/23

## 2023-11-23 NOTE — Plan of Care (Signed)
   Problem: Health Behavior/Discharge Planning: Goal: Ability to manage health-related needs will improve Outcome: Progressing   Problem: Clinical Measurements: Goal: Ability to maintain clinical measurements within normal limits will improve Outcome: Progressing Goal: Will remain free from infection Outcome: Progressing Goal: Diagnostic test results will improve Outcome: Progressing

## 2023-11-23 NOTE — Progress Notes (Signed)
 Occupational Therapy Treatment Patient Details Name: Rachel Burns MRN: [id number] DOB: [birth year] Today's Date: 11/23/2023   History of present illness Pt is a 73 y/o F s/p L4-5 ALIF and PSIF. PMH includes HTN, GERD, diabetes, chronic pain, L4-5 laminectomy   OT comments  Pt progressing toward goals, needs up to min A for ADLs, CGA for bed mobility and CGA for transfers with RW. Reiterated back precautions with pt and discussed use of reacher for LB ADLs. Pt presenting with impairments listed below, will follow acutely. Anticipate no OT follow up needs at d/c.       If plan is discharge home, recommend the following:  A little help with walking and/or transfers;A little help with bathing/dressing/bathroom;Assistance with cooking/housework;Assist for transportation;Help with stairs or ramp for entrance   Equipment Recommendations  Tub/shower seat    Recommendations for Other Services PT consult    Precautions / Restrictions Precautions Precautions: Fall;Back Precaution Booklet Issued: Yes (comment) Recall of Precautions/Restrictions: Intact Precaution/Restrictions Comments: Able to recall 3/3 precautions Required Braces or Orthoses: Other Brace Restrictions Weight Bearing Restrictions Per Provider Order: No       Mobility Bed Mobility Overal bed mobility: Needs Assistance Bed Mobility: Sidelying to Sit, Rolling Rolling: Contact guard assist         General bed mobility comments: cues for log roll technique, MinA to assist with trunk raise    Transfers Overall transfer level: Needs assistance Equipment used: Rolling walker (2 wheels) Transfers: Sit to/from Stand Sit to Stand: Contact guard assist                 Balance Overall balance assessment: Needs assistance Sitting-balance support: Feet supported Sitting balance-Leahy Scale: Good     Standing balance support: During functional activity, Reliant on assistive device for balance Standing  balance-Leahy Scale: Fair Standing balance comment: static standing without AD                           ADL either performed or assessed with clinical judgement   ADL Overall ADL's : Needs assistance/impaired     Grooming: Set up;Oral care;Wash/dry face;Applying deodorant;Standing           Upper Body Dressing : Minimal assistance;Sitting   Lower Body Dressing: Minimal assistance;Sitting/lateral leans   Toilet Transfer: Minimal assistance;Ambulation;Regular Toilet           Functional mobility during ADLs: Minimal assistance;Rolling walker (2 wheels)      Extremity/Trunk Assessment Upper Extremity Assessment Upper Extremity Assessment: Generalized weakness   Lower Extremity Assessment Lower Extremity Assessment: Defer to PT evaluation        Vision   Vision Assessment?: No apparent visual deficits   Perception Perception Perception: Not tested   Praxis Praxis Praxis: Not tested   Communication Communication Communication: No apparent difficulties   Cognition Arousal: Alert Behavior During Therapy: WFL for tasks assessed/performed Cognition: No apparent impairments                               Following commands: Intact        Cueing   Cueing Techniques: Verbal cues  Exercises      Shoulder Instructions       General Comments VSS    Pertinent Vitals/ Pain       Pain Assessment Pain Assessment: Faces Pain Score: 2  Faces Pain Scale: Hurts a little bit Pain Location: back Pain Descriptors /  Indicators: Discomfort Pain Intervention(s): Limited activity within patient's tolerance  Home Living                                          Prior Functioning/Environment              Frequency  Min 2X/week        Progress Toward Goals  OT Goals(current goals can now be found in the care plan section)  Progress towards OT goals: Progressing toward goals  Acute Rehab OT Goals Patient  Stated Goal: none stated OT Goal Formulation: With patient Time For Goal Achievement: 12/06/23 Potential to Achieve Goals: Good ADL Goals Pt Will Perform Upper Body Dressing: with supervision;sitting Pt Will Perform Lower Body Dressing: with supervision;sit to/from stand;sitting/lateral leans;with adaptive equipment Pt Will Transfer to Toilet: with supervision;ambulating;regular height toilet Pt Will Perform Tub/Shower Transfer: Tub transfer;Shower transfer;with supervision;ambulating;rolling walker  Plan      Co-evaluation                 AM-PAC OT 6 Clicks Daily Activity     Outcome Measure   Help from another person eating meals?: None Help from another person taking care of personal grooming?: None Help from another person toileting, which includes using toliet, bedpan, or urinal?: A Little Help from another person bathing (including washing, rinsing, drying)?: A Lot Help from another person to put on and taking off regular upper body clothing?: A Little Help from another person to put on and taking off regular lower body clothing?: A Little 6 Click Score: 19    End of Session Equipment Utilized During Treatment: Rolling walker (2 wheels)  OT Visit Diagnosis: Unsteadiness on feet (R26.81);Other abnormalities of gait and mobility (R26.89);Muscle weakness (generalized) (M62.81)   Activity Tolerance Patient tolerated treatment well   Patient Left in chair;with call bell/phone within reach   Nurse Communication Mobility status        Time: 9196-9166 OT Time Calculation (min): 30 min  Charges: OT General Charges $OT Visit: 1 Visit OT Evaluation $OT Eval Low Complexity: 1 Low OT Treatments $Self Care/Home Management : 8-22 mins  Pearse Shiffler K, OTD, OTR/L SecureChat Preferred Acute Rehab [phone]   Derion Kreiter K Koonce 11/23/2023, 10:10 AM

## 2023-11-23 NOTE — Progress Notes (Signed)
 Physical Therapy Treatment Patient Details Name: Rachel Burns MRN: [id number] DOB: 1950/08/09 Today's Date: 11/23/2023   History of Present Illness Pt is a 73 y/o F s/p L4-5 ALIF and PSIF. PMH includes HTN, GERD, diabetes, chronic pain, L4-5 laminectomy    PT Comments  Patient met in supine, reporting 4/10 pain in low back. Also reports onset of nausea but associating this with recently drinking Glucerna. Patient completes bed mobility with min/CGA with difficulty completing trunk righting from sidelying position. Ambulated at best 140 feet with 2WW and CGA, occasional standing rest break due to nausea. Participated in stair training with hand held assist as patient will not have railings to hold for support. Ascended 4 stairs with minA and modA for descending with hand held assist. Educated patient on benefits of frequent mobility, walking program, and recommendations for follow up therapy. Will continue to follow patient acutely, but cleared for d/c home. Recommend initial increased assist/supervision, use of 2WW for ambulation, +1 assist or stairs, and HHPT.    If plan is discharge home, recommend the following: A little help with walking and/or transfers;Assist for transportation;Help with stairs or ramp for entrance   Can travel by private vehicle        Equipment Recommendations  None recommended by PT;Other (comment) (Patient has 2WW.)    Recommendations for Other Services       Precautions / Restrictions Precautions Precautions: Fall;Back Recall of Precautions/Restrictions: Intact Precaution/Restrictions Comments: Able to recall 3/3 precautions Other Brace: no brace needed per orders Restrictions Weight Bearing Restrictions Per Provider Order: No     Mobility  Bed Mobility Overal bed mobility: Needs Assistance Bed Mobility: Rolling, Sidelying to Sit, Sit to Supine Rolling: Contact guard assist Sidelying to sit: Min assist   Sit to supine: Min assist   General bed  mobility comments: For sidelying to sit transfer, required minA for assist to get her elbow under trunk for righting. When returning to supine, assist for placing LLE onto EOB.    Transfers Overall transfer level: Needs assistance Equipment used: Rolling walker (2 wheels) Transfers: Sit to/from Stand Sit to Stand: Contact guard assist           General transfer comment: CGA from EOB and w/c with occasional cues for hand placement    Ambulation/Gait Ambulation/Gait assistance: Contact guard assist Gait Distance (Feet): 140 Feet Assistive device: Rolling walker (2 wheels) Gait Pattern/deviations: Step-through pattern, Decreased stride length       General Gait Details: Slow gait speed, limited by nausea. No LOB observed.   Stairs Stairs: Yes Stairs assistance: Min assist, Mod assist Stair Management: No rails Number of Stairs: 4 General stair comments: Patient instructed in ascending/descending 2 stairs x2. Patient does not have rails at home, instructed to complete via hand held assist. Min/CGA for ascending but requires min/modA for descending. Educated on having walker set up at top of steps for ascending and bottom of steps for descending.   Wheelchair Mobility     Tilt Bed    Modified Rankin (Stroke Patients Only)       Balance Overall balance assessment: Needs assistance Sitting-balance support: Feet supported Sitting balance-Leahy Scale: Good       Standing balance-Leahy Scale: Fair Standing balance comment: static standing without AD with supervision. Benefits from B UE support with ambulation secondary to nausea and mild unsteadiness.  Communication Communication Communication: No apparent difficulties  Cognition Arousal: Alert Behavior During Therapy: WFL for tasks assessed/performed   PT - Cognitive impairments: No apparent impairments                         Following commands: Intact       Cueing Cueing Techniques: Verbal cues, Tactile cues  Exercises      General Comments General comments (skin integrity, edema, etc.): VSS      Pertinent Vitals/Pain Pain Assessment Pain Assessment: 0-10 Pain Score: 4  Pain Location: back Pain Descriptors / Indicators: Discomfort Pain Intervention(s): Limited activity within patient's tolerance, Monitored during session    Home Living                          Prior Function            PT Goals (current goals can now be found in the care plan section) Progress towards PT goals: Progressing toward goals    Frequency    Min 5X/week      PT Plan      Co-evaluation              AM-PAC PT 6 Clicks Mobility   Outcome Measure  Help needed turning from your back to your side while in a flat bed without using bedrails?: None Help needed moving from lying on your back to sitting on the side of a flat bed without using bedrails?: A Little Help needed moving to and from a bed to a chair (including a wheelchair)?: A Little Help needed standing up from a chair using your arms (e.g., wheelchair or bedside chair)?: A Little Help needed to walk in hospital room?: A Little Help needed climbing 3-5 steps with a railing? : A Little 6 Click Score: 19    End of Session Equipment Utilized During Treatment: Gait belt Activity Tolerance: Patient tolerated treatment well Patient left: with call bell/phone within reach;in bed Nurse Communication: Mobility status PT Visit Diagnosis: Other abnormalities of gait and mobility (R26.89);Muscle weakness (generalized) (M62.81)     Time: 8940-8867 PT Time Calculation (min) (ACUTE ONLY): 33 min  Charges:    $Gait Training: 8-22 mins $Therapeutic Activity: 8-22 mins PT General Charges $$ ACUTE PT VISIT: 1 Visit                     Rachel Hinds, PT, DPT [facility] Acute Rehabilitation Office: [phone]    Rachel Burns 11/23/2023, 12:25 PM

## 2023-11-24 ENCOUNTER — Encounter (HOSPITAL_COMMUNITY): Payer: Self-pay | Admitting: Orthopedic Surgery

## 2023-11-25 ENCOUNTER — Telehealth: Payer: Self-pay | Admitting: Radiology

## 2023-11-25 NOTE — Telephone Encounter (Signed)
 Patient had surgery and rx's were sent to the pharmacy, then was said needed prior approval- meds still not approved, said it is pending.  Pharmacy has called us /reached out.  Patient has been taking leftover meds from a surgery last year.  She needs this medication, please follow up on the auth?  Let patient know?  Thanks

## 2023-11-26 MED FILL — Heparin Sodium (Porcine) Inj 1000 Unit/ML: INTRAMUSCULAR | Qty: 30 | Status: AC

## 2023-11-26 MED FILL — Sodium Chloride IV Soln 0.9%: INTRAVENOUS | Qty: 2000 | Status: AC

## 2023-12-04 ENCOUNTER — Other Ambulatory Visit: Payer: Self-pay

## 2023-12-04 ENCOUNTER — Ambulatory Visit: Admitting: Orthopedic Surgery

## 2023-12-04 DIAGNOSIS — Z981 Arthrodesis status: Secondary | ICD-10-CM

## 2023-12-04 MED ORDER — OXYCODONE HCL 5 MG PO TABS: 5.0000 mg | ORAL_TABLET | ORAL | 0 refills | Status: AC | PRN

## 2023-12-04 NOTE — Progress Notes (Signed)
 Orthopedic Surgery Post-operative Office Visit  Procedure: L4/5 ALIF and PSIF Date of Surgery: 11/21/2023 (~2 weeks post-op)  Assessment: Patient is a 73 y.o. who has noticed improvement in her radiating leg pain since surgery.  Back pain slowly getting better   Plan: -Operative plans complete -Out of bed as tolerated, no brace -No bending/lifting/twisting greater than 10 pounds -Since her pain has gotten better since surgery, I expected to continue to get better since she is still in the recovery period -Okay to let soap/water  run over the incision but do not submerge -Pain management: oxycodone , tylenol  -Return to office in 4 weeks, x-rays needed at next visit: AP/lateral lumbar   ___________________________________________________________________________   Subjective: Patient has noticed improvement in her radiating leg pain since surgery.  Her knee,, lateral thigh, and leg pain are better since surgery.  She still has some numbness and paresthesias in that area.  She does continue to have right buttock pain.  This pain is also better since surgery but is still present.  She has had significant back pain after surgery but that has been getting better with time too.  She has not noticed any redness or drainage around her incisions.  No radiating left leg pain.  Objective:  General: no acute distress, appropriate affect Neurologic: alert, answering questions appropriately, following commands Respiratory: unlabored breathing on room air Skin: incision appears to be healing as appropriately with no evidence of infection, no drainage  MSK (spine):  -Strength exam      Left  Right  EHL    5/5  5/5 TA    5/5  5/5 GSC    5/5  5/5 Knee extension  5/5  5/5 Hip flexion   5/5  5/5  -Sensory exam    Sensation intact to light touch in L3-S1 nerve distributions of bilateral lower extremities  Imaging: X-rays of the lumbar spine from 12/04/2023 were independently reviewed and  interpreted, showing posterior instrumentation at L4 and L5.  Instrumentation appears in appropriate place.  There is interbody device in the L4/5 disc space.  Interbody appears in appropriate position. No dislocation seen.    Patient name: Rachel Burns [name] Patient MRN: [id number] Date of visit: 12/04/23

## 2023-12-15 ENCOUNTER — Telehealth: Payer: Self-pay | Admitting: Orthopaedic Surgery

## 2023-12-15 NOTE — Telephone Encounter (Signed)
 Patient called. She would like gel injection in her knee.

## 2023-12-18 NOTE — Telephone Encounter (Signed)
 Noted

## 2023-12-29 ENCOUNTER — Other Ambulatory Visit: Payer: Self-pay

## 2023-12-29 DIAGNOSIS — M1711 Unilateral primary osteoarthritis, right knee: Secondary | ICD-10-CM

## 2023-12-29 DIAGNOSIS — M1712 Unilateral primary osteoarthritis, left knee: Secondary | ICD-10-CM

## 2023-12-29 NOTE — Telephone Encounter (Signed)
 VOB submitted for Orthovisc, bilateral knee

## 2024-01-01 ENCOUNTER — Encounter: Admitting: Orthopedic Surgery

## 2024-01-05 ENCOUNTER — Other Ambulatory Visit: Payer: Self-pay

## 2024-01-05 ENCOUNTER — Encounter: Payer: Self-pay | Admitting: Radiology

## 2024-01-05 ENCOUNTER — Ambulatory Visit: Admitting: Orthopedic Surgery

## 2024-01-05 DIAGNOSIS — Z981 Arthrodesis status: Secondary | ICD-10-CM | POA: Diagnosis not present

## 2024-01-05 NOTE — Progress Notes (Signed)
 Orthopedic Surgery Post-operative Office Visit   Procedure: L4/5 ALIF and PSIF Date of Surgery: 11/21/2023 (~6 weeks post-op)   Assessment: Patient is a 73 y.o. who is still noting improvement in her symptoms since surgery but she still has right sided low back pain and right buttock pain     Plan: -Operative plans complete -Out of bed as tolerated, no brace -No bending/lifting/twisting greater than 10 pounds -Her pain continues to be better than before surgery. Did go over the potential cage subsidence with the patient today. Will continue to monitor -Okay to submerge wounds at this point -Pain management: oxycodone , tylenol  -Return to office in 6 weeks, x-rays needed at next visit: AP/lateral/flex/ex lumbar    ___________________________________________________________________________     Subjective: Patient still feels that she is doing better than where she was pre-operatively. However, she is still having right sided low back pain and right buttock pain. It tends to get worse if she is more active. She can walk up to a couple of blocks before pain starts. It gets better after she rests and then she can do more. She is up to walking a 0.74mile with this pattern of walking and then resting. She has not had any return of the lateral thigh or leg pain. Has not seen any redness or drainage around her incisions.   Objective:   General: no acute distress, appropriate affect Neurologic: alert, answering questions appropriately, following commands Respiratory: unlabored breathing on room air Skin: incisions are well healed with no erythema, induration, active/expressible drainage    MSK (spine):   -Strength exam                                                   Left                  Right   EHL                              5/5                  5/5 TA                                 5/5                  5/5 GSC                             5/5                  5/5 Knee extension             5/5                  5/5 Hip flexion                    5/5                  5/5   -Sensory exam                           Sensation intact to light touch in  L3-S1 nerve distributions of bilateral lower extremities   Imaging: X-rays of the lumbar spine from 01/05/2024 were independently reviewed and interpreted, showing interbody device at L4/5. There appears to be subsidence of the cage into the L4 vertebra. The cage stills is in appropriate position within the former L4/5 disc space. There is posterior instrumentation at L4 and L5. No lucency seen around the screws. No fracture or dislocation seen.      Patient name: Rachel Burns [facility] Patient MRN: [id number] Date of visit: 01/05/24

## 2024-01-20 ENCOUNTER — Ambulatory Visit: Admitting: Physician Assistant

## 2024-01-20 DIAGNOSIS — M170 Bilateral primary osteoarthritis of knee: Secondary | ICD-10-CM | POA: Diagnosis not present

## 2024-01-20 MED ORDER — BUPIVACAINE HCL 0.25 % IJ SOLN
5.0000 mL | INTRAMUSCULAR | Status: AC | PRN
  Administered 2024-01-20: 5 mL via INTRA_ARTICULAR

## 2024-01-20 MED ORDER — HYALURONAN 30 MG/2ML IX SOSY
30.0000 mg | PREFILLED_SYRINGE | INTRA_ARTICULAR | Status: AC | PRN
  Administered 2024-01-20: 30 mg via INTRA_ARTICULAR

## 2024-01-20 MED ORDER — LIDOCAINE HCL 1 % IJ SOLN
5.0000 mL | INTRAMUSCULAR | Status: AC | PRN
  Administered 2024-01-20: 5 mL

## 2024-01-20 NOTE — Progress Notes (Signed)
   Procedure Note  Patient: [name] [facility]             Date of Birth: 11-24-1950           MRN: [id number]             Visit Date: 01/20/2024  Procedures: Visit Diagnoses:  1. Bilateral primary osteoarthritis of knee     Large Joint Inj: bilateral knee on 01/20/2024 3:50 PM Indications: pain Details: 22 G needle, anterolateral approach Medications (Right): 5 mL lidocaine  1 %; 5 mL bupivacaine  0.25 %; 30 mg Hyaluronan 30 MG/2ML Medications (Left): 5 mL lidocaine  1 %; 5 mL bupivacaine  0.25 %; 30 mg Hyaluronan 30 MG/2ML

## 2024-01-30 ENCOUNTER — Other Ambulatory Visit: Payer: Self-pay | Admitting: Orthopedic Surgery

## 2024-02-03 ENCOUNTER — Ambulatory Visit: Admitting: Physician Assistant

## 2024-02-03 DIAGNOSIS — M170 Bilateral primary osteoarthritis of knee: Secondary | ICD-10-CM

## 2024-02-03 MED ORDER — LIDOCAINE HCL 1 % IJ SOLN
3.0000 mL | INTRAMUSCULAR | Status: AC | PRN
  Administered 2024-02-03: 3 mL

## 2024-02-03 MED ORDER — BUPIVACAINE HCL 0.25 % IJ SOLN
0.6600 mL | INTRAMUSCULAR | Status: AC | PRN
  Administered 2024-02-03: .66 mL via INTRA_ARTICULAR

## 2024-02-03 MED ORDER — HYALURONAN 30 MG/2ML IX SOSY
30.0000 mg | PREFILLED_SYRINGE | INTRA_ARTICULAR | Status: AC | PRN
  Administered 2024-02-03: 30 mg via INTRA_ARTICULAR

## 2024-02-03 NOTE — Progress Notes (Signed)
   Procedure Note  [name] [facility]             Date of Birth: 08-14-1950           MRN: [id number]             Visit Date: 02/03/2024  Procedures: Visit Diagnoses:  1. Bilateral primary osteoarthritis of knee     Large Joint Inj: bilateral knee on 02/03/2024 3:49 PM Indications: pain Details: 22 G needle, anterolateral approach Medications (Right): 0.66 mL bupivacaine  0.25 %; 3 mL lidocaine  1 %; 30 mg Hyaluronan 30 MG/2ML Medications (Left): 0.66 mL bupivacaine  0.25 %; 3 mL lidocaine  1 %; 30 mg Hyaluronan 30 MG/2ML

## 2024-02-10 ENCOUNTER — Ambulatory Visit: Admitting: Physician Assistant

## 2024-02-10 DIAGNOSIS — M170 Bilateral primary osteoarthritis of knee: Secondary | ICD-10-CM

## 2024-02-10 MED ORDER — LIDOCAINE HCL 1 % IJ SOLN
3.0000 mL | INTRAMUSCULAR | Status: AC | PRN
  Administered 2024-02-10: 3 mL

## 2024-02-10 MED ORDER — BUPIVACAINE HCL 0.25 % IJ SOLN
0.6600 mL | INTRAMUSCULAR | Status: AC | PRN
  Administered 2024-02-10: .66 mL via INTRA_ARTICULAR

## 2024-02-10 MED ORDER — HYALURONAN 30 MG/2ML IX SOSY
30.0000 mg | PREFILLED_SYRINGE | INTRA_ARTICULAR | Status: AC | PRN
  Administered 2024-02-10: 30 mg via INTRA_ARTICULAR

## 2024-02-10 NOTE — Progress Notes (Signed)
   Procedure Note  [name] [facility]             Date of Birth: 1950-04-22           MRN: [id number]             Visit Date: 02/10/2024  Procedures: Visit Diagnoses:  1. Bilateral primary osteoarthritis of knee     Large Joint Inj: bilateral knee on 02/10/2024 10:31 AM Indications: pain Details: 22 G needle, anterolateral approach Medications (Right): 0.66 mL bupivacaine  0.25 %; 3 mL lidocaine  1 %; 30 mg Hyaluronan 30 MG/2ML Medications (Left): 0.66 mL bupivacaine  0.25 %; 3 mL lidocaine  1 %; 30 mg Hyaluronan 30 MG/2ML

## 2024-02-16 ENCOUNTER — Other Ambulatory Visit: Payer: Self-pay

## 2024-02-16 ENCOUNTER — Ambulatory Visit: Admitting: Orthopedic Surgery

## 2024-02-16 DIAGNOSIS — Z981 Arthrodesis status: Secondary | ICD-10-CM

## 2024-02-16 DIAGNOSIS — M5416 Radiculopathy, lumbar region: Secondary | ICD-10-CM

## 2024-02-18 NOTE — Progress Notes (Signed)
 Orthopedic Surgery Post-operative Office Visit   Procedure: L4/5 ALIF and PSIF Date of Surgery: 11/21/2023 (~3 months post-op)   Assessment: Patient is a 73 y.o. who is still noting improvement in her symptoms since surgery but is still having buttock pain and has not been able to be as active as she would like     Plan: -No spine specific precautions at this point -Her pain has been continuing to get better with time, so we will continue to monitor and treat conservatively -Recommended a diagnostic/therapeutic transforaminal injection -She did notice improvement in her pain with pressure over the lumbar spine, so want her to try a lumbar corset for additional pain relief.  Told her to use it when she is more active and she should be out of it otherwise -Pain management: lyrica , tylenol  -Return to office in 3 months, x-rays needed at next visit: AP/lateral/flex/ex lumbar    ___________________________________________________________________________     Subjective: Patient has noticed improvement since her last visit and has noticed improvement since surgery.  She is still not where she would like to be.  She is less active as a result of her pain.  She is feeling pain in the right buttock.  She is also having bilateral knee pain and has been getting injections for her knee pain.  She is still taking Lyrica  at this point.  She is also using Tylenol  as needed.  Does not have any pain radiating along the lateral aspect of the thigh or leg.   Objective:   General: no acute distress, appropriate affect Neurologic: alert, answering questions appropriately, following commands Respiratory: unlabored breathing on room air Skin: incisions are well healed   MSK (spine):   -Strength exam                                                   Left                  Right   EHL                              5/5                  5/5 TA                                 5/5                  5/5 GSC                              5/5                  5/5 Knee extension            5/5                  5/5 Hip flexion                    5/5                  5/5   -Sensory exam  Sensation intact to light touch in L3-S1 nerve distributions of bilateral lower extremities   Imaging: XRs of the lumbar spine from 02/16/2024 were independently reviewed and interpreted, showing posterior instrumentation at L4 and L5.  No lucency seen around the instrumentation.  There is an interbody device at L4/5.  There appears to be some subsidence of the cage into the L4 vertebral body.  It does not appear to be as significant as I previously noted on the 01/05/2024 films.  No fracture or dislocation seen.      Patient name: Rachel Burns [facility] Patient MRN: [id number] Date of visit: 02/16/2024

## 2024-04-29 ENCOUNTER — Encounter: Admitting: Physical Medicine and Rehabilitation

## 2024-05-17 ENCOUNTER — Ambulatory Visit: Admitting: Orthopedic Surgery
